# Patient Record
Sex: Female | Born: 1985 | Race: White | Hispanic: No | Smoking: Light tobacco smoker
Health system: Southern US, Community
[De-identification: ages and names within clinical notes are randomized; demographics above are authoritative.]

## PROBLEM LIST (undated history)

## (undated) DIAGNOSIS — N159 Renal tubulo-interstitial disease, unspecified: Secondary | ICD-10-CM

## (undated) DIAGNOSIS — Z8489 Family history of other specified conditions: Secondary | ICD-10-CM

## (undated) DIAGNOSIS — D649 Anemia, unspecified: Secondary | ICD-10-CM

## (undated) DIAGNOSIS — L0291 Cutaneous abscess, unspecified: Secondary | ICD-10-CM

## (undated) DIAGNOSIS — E282 Polycystic ovarian syndrome: Secondary | ICD-10-CM

## (undated) DIAGNOSIS — J9819 Other pulmonary collapse: Secondary | ICD-10-CM

## (undated) HISTORY — PX: BACK SURGERY: SHX140

## (undated) HISTORY — PX: CHOLECYSTECTOMY: SHX55

---
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
## 2004-03-18 ENCOUNTER — Emergency Department (HOSPITAL_COMMUNITY): Admission: EM | Admit: 2004-03-18 | Discharge: 2004-03-18 | Payer: Self-pay | Admitting: Emergency Medicine

## 2004-05-29 ENCOUNTER — Emergency Department (HOSPITAL_COMMUNITY): Admission: EM | Admit: 2004-05-29 | Discharge: 2004-05-29 | Payer: Self-pay | Admitting: Emergency Medicine

## 2004-08-25 ENCOUNTER — Emergency Department (HOSPITAL_COMMUNITY): Admission: EM | Admit: 2004-08-25 | Discharge: 2004-08-25 | Payer: Self-pay | Admitting: Emergency Medicine

## 2005-05-10 ENCOUNTER — Emergency Department (HOSPITAL_COMMUNITY): Admission: EM | Admit: 2005-05-10 | Discharge: 2005-05-10 | Payer: Self-pay | Admitting: Emergency Medicine

## 2007-07-21 ENCOUNTER — Emergency Department (HOSPITAL_COMMUNITY): Admission: EM | Admit: 2007-07-21 | Discharge: 2007-07-21 | Payer: Self-pay | Admitting: Emergency Medicine

## 2007-10-16 ENCOUNTER — Emergency Department (HOSPITAL_COMMUNITY): Admission: EM | Admit: 2007-10-16 | Discharge: 2007-10-17 | Payer: Self-pay | Admitting: Emergency Medicine

## 2007-10-26 ENCOUNTER — Inpatient Hospital Stay (HOSPITAL_COMMUNITY): Admission: AD | Admit: 2007-10-26 | Discharge: 2007-10-26 | Payer: Self-pay | Admitting: Obstetrics and Gynecology

## 2007-11-13 ENCOUNTER — Emergency Department (HOSPITAL_COMMUNITY): Admission: EM | Admit: 2007-11-13 | Discharge: 2007-11-13 | Payer: Self-pay | Admitting: Family Medicine

## 2007-11-21 ENCOUNTER — Inpatient Hospital Stay (HOSPITAL_COMMUNITY): Admission: AD | Admit: 2007-11-21 | Discharge: 2007-11-21 | Payer: Self-pay | Admitting: Obstetrics & Gynecology

## 2008-01-02 ENCOUNTER — Inpatient Hospital Stay (HOSPITAL_COMMUNITY): Admission: AD | Admit: 2008-01-02 | Discharge: 2008-01-02 | Payer: Self-pay | Admitting: Obstetrics & Gynecology

## 2008-01-09 ENCOUNTER — Inpatient Hospital Stay (HOSPITAL_COMMUNITY): Admission: AD | Admit: 2008-01-09 | Discharge: 2008-01-10 | Payer: Self-pay | Admitting: Obstetrics & Gynecology

## 2008-01-10 ENCOUNTER — Inpatient Hospital Stay (HOSPITAL_COMMUNITY): Admission: AD | Admit: 2008-01-10 | Discharge: 2008-01-10 | Payer: Self-pay | Admitting: Obstetrics & Gynecology

## 2008-01-15 ENCOUNTER — Emergency Department (HOSPITAL_COMMUNITY): Admission: EM | Admit: 2008-01-15 | Discharge: 2008-01-15 | Payer: Self-pay | Admitting: Emergency Medicine

## 2008-01-24 ENCOUNTER — Ambulatory Visit (HOSPITAL_COMMUNITY): Admission: RE | Admit: 2008-01-24 | Discharge: 2008-01-24 | Payer: Self-pay | Admitting: Obstetrics

## 2008-03-25 ENCOUNTER — Ambulatory Visit (HOSPITAL_COMMUNITY): Admission: RE | Admit: 2008-03-25 | Discharge: 2008-03-25 | Payer: Self-pay | Admitting: Obstetrics

## 2008-03-26 ENCOUNTER — Inpatient Hospital Stay (HOSPITAL_COMMUNITY): Admission: AD | Admit: 2008-03-26 | Discharge: 2008-03-26 | Payer: Self-pay | Admitting: Obstetrics

## 2008-05-14 ENCOUNTER — Inpatient Hospital Stay (HOSPITAL_COMMUNITY): Admission: AD | Admit: 2008-05-14 | Discharge: 2008-05-14 | Payer: Self-pay | Admitting: Obstetrics

## 2008-05-16 ENCOUNTER — Inpatient Hospital Stay (HOSPITAL_COMMUNITY): Admission: AD | Admit: 2008-05-16 | Discharge: 2008-05-21 | Payer: Self-pay | Admitting: Obstetrics

## 2008-05-18 ENCOUNTER — Encounter: Payer: Self-pay | Admitting: Obstetrics & Gynecology

## 2008-05-18 DIAGNOSIS — O1490 Unspecified pre-eclampsia, unspecified trimester: Secondary | ICD-10-CM

## 2008-06-14 ENCOUNTER — Inpatient Hospital Stay (HOSPITAL_COMMUNITY): Admission: AD | Admit: 2008-06-14 | Discharge: 2008-06-14 | Payer: Self-pay | Admitting: Obstetrics & Gynecology

## 2009-01-02 ENCOUNTER — Emergency Department (HOSPITAL_COMMUNITY): Admission: EM | Admit: 2009-01-02 | Discharge: 2009-01-02 | Payer: Self-pay | Admitting: Emergency Medicine

## 2009-02-02 ENCOUNTER — Emergency Department (HOSPITAL_COMMUNITY): Admission: EM | Admit: 2009-02-02 | Discharge: 2009-02-03 | Payer: Self-pay | Admitting: Emergency Medicine

## 2009-04-26 ENCOUNTER — Emergency Department (HOSPITAL_COMMUNITY): Admission: EM | Admit: 2009-04-26 | Discharge: 2009-04-26 | Payer: Self-pay | Admitting: Emergency Medicine

## 2009-06-18 ENCOUNTER — Emergency Department (HOSPITAL_COMMUNITY): Admission: EM | Admit: 2009-06-18 | Discharge: 2009-06-18 | Payer: Self-pay | Admitting: Internal Medicine

## 2009-08-21 ENCOUNTER — Emergency Department (HOSPITAL_COMMUNITY): Admission: EM | Admit: 2009-08-21 | Discharge: 2009-08-21 | Payer: Self-pay | Admitting: Emergency Medicine

## 2009-12-16 ENCOUNTER — Emergency Department (HOSPITAL_COMMUNITY): Admission: EM | Admit: 2009-12-16 | Discharge: 2009-12-16 | Payer: Self-pay | Admitting: Emergency Medicine

## 2009-12-31 ENCOUNTER — Emergency Department (HOSPITAL_COMMUNITY): Admission: EM | Admit: 2009-12-31 | Discharge: 2009-12-31 | Payer: Self-pay | Admitting: Emergency Medicine

## 2010-01-02 ENCOUNTER — Emergency Department (HOSPITAL_COMMUNITY): Admission: EM | Admit: 2010-01-02 | Discharge: 2010-01-03 | Payer: Self-pay | Admitting: Emergency Medicine

## 2010-01-28 ENCOUNTER — Emergency Department (HOSPITAL_COMMUNITY): Admission: EM | Admit: 2010-01-28 | Discharge: 2010-01-28 | Payer: Self-pay | Admitting: Emergency Medicine

## 2010-06-29 ENCOUNTER — Emergency Department (HOSPITAL_COMMUNITY)
Admission: EM | Admit: 2010-06-29 | Discharge: 2010-06-30 | Payer: Self-pay | Source: Home / Self Care | Admitting: Emergency Medicine

## 2010-07-01 LAB — POCT I-STAT, CHEM 8
BUN: 7 mg/dL (ref 6–23)
Calcium, Ion: 1.19 mmol/L (ref 1.12–1.32)
Chloride: 106 mEq/L (ref 96–112)
Creatinine, Ser: 0.9 mg/dL (ref 0.4–1.2)
Glucose, Bld: 96 mg/dL (ref 70–99)
HCT: 43 % (ref 36.0–46.0)
Hemoglobin: 14.6 g/dL (ref 12.0–15.0)
Potassium: 3.7 mEq/L (ref 3.5–5.1)
Sodium: 142 mEq/L (ref 135–145)
TCO2: 27 mmol/L (ref 0–100)

## 2010-07-01 LAB — DIFFERENTIAL
Basophils Absolute: 0 10*3/uL (ref 0.0–0.1)
Basophils Relative: 0 % (ref 0–1)
Eosinophils Absolute: 0.6 10*3/uL (ref 0.0–0.7)
Eosinophils Relative: 6 % — ABNORMAL HIGH (ref 0–5)
Lymphocytes Relative: 27 % (ref 12–46)
Lymphs Abs: 2.7 10*3/uL (ref 0.7–4.0)
Monocytes Absolute: 0.6 10*3/uL (ref 0.1–1.0)
Monocytes Relative: 6 % (ref 3–12)
Neutro Abs: 6.2 10*3/uL (ref 1.7–7.7)
Neutrophils Relative %: 61 % (ref 43–77)

## 2010-07-01 LAB — URINALYSIS, ROUTINE W REFLEX MICROSCOPIC
Bilirubin Urine: NEGATIVE
Ketones, ur: NEGATIVE mg/dL
Leukocytes, UA: NEGATIVE
Nitrite: NEGATIVE
Protein, ur: NEGATIVE mg/dL
Specific Gravity, Urine: 1.026 (ref 1.005–1.030)
Urine Glucose, Fasting: NEGATIVE mg/dL
Urobilinogen, UA: 1 mg/dL (ref 0.0–1.0)
pH: 6 (ref 5.0–8.0)

## 2010-07-01 LAB — POCT PREGNANCY, URINE: Preg Test, Ur: NEGATIVE

## 2010-07-01 LAB — URINE MICROSCOPIC-ADD ON

## 2010-07-01 LAB — WET PREP, GENITAL
Clue Cells Wet Prep HPF POC: NONE SEEN
Trich, Wet Prep: NONE SEEN
WBC, Wet Prep HPF POC: NONE SEEN
Yeast Wet Prep HPF POC: NONE SEEN

## 2010-07-01 LAB — CBC
HCT: 40.9 % (ref 36.0–46.0)
Hemoglobin: 13.6 g/dL (ref 12.0–15.0)
MCH: 27.4 pg (ref 26.0–34.0)
MCHC: 33.3 g/dL (ref 30.0–36.0)
MCV: 82.3 fL (ref 78.0–100.0)
Platelets: 243 10*3/uL (ref 150–400)
RBC: 4.97 MIL/uL (ref 3.87–5.11)
RDW: 13.3 % (ref 11.5–15.5)
WBC: 10.2 10*3/uL (ref 4.0–10.5)

## 2010-07-01 LAB — GC/CHLAMYDIA PROBE AMP, GENITAL
Chlamydia, DNA Probe: NEGATIVE
GC Probe Amp, Genital: NEGATIVE

## 2010-08-29 LAB — HCG, QUANTITATIVE, PREGNANCY: hCG, Beta Chain, Quant, S: <2 m[IU]/mL (ref <5)

## 2010-09-16 LAB — COMPREHENSIVE METABOLIC PANEL
ALT: <8 U/L (ref 0–35)
AST: 28 U/L (ref 0–37)
Albumin: 4.3 g/dL (ref 3.5–5.2)
Alkaline Phosphatase: 57 U/L (ref 39–117)
BUN: 6 mg/dL (ref 6–23)
CO2: 26 mEq/L (ref 19–32)
Calcium: 8.6 mg/dL (ref 8.4–10.5)
Chloride: 101 mEq/L (ref 96–112)
Creatinine, Ser: 0.75 mg/dL (ref 0.4–1.2)
GFR calc Af Amer: >60 mL/min (ref >60)
GFR calc non Af Amer: >60 mL/min (ref >60)
Glucose, Bld: 89 mg/dL (ref 70–99)
Potassium: 4.5 mEq/L (ref 3.5–5.1)
Sodium: 133 mEq/L — ABNORMAL LOW (ref 135–145)
Total Bilirubin: 1.2 mg/dL (ref 0.3–1.2)
Total Protein: 7.8 g/dL (ref 6.0–8.3)

## 2010-09-16 LAB — CBC
HCT: 43.9 % (ref 36.0–46.0)
Hemoglobin: 14.9 g/dL (ref 12.0–15.0)
MCHC: 34 g/dL (ref 30.0–36.0)
MCV: 82.3 fL (ref 78.0–100.0)
Platelets: 231 10*3/uL (ref 150–400)
RBC: 5.33 MIL/uL — ABNORMAL HIGH (ref 3.87–5.11)
RDW: 14.3 % (ref 11.5–15.5)
WBC: 7.4 10*3/uL (ref 4.0–10.5)

## 2010-09-16 LAB — URINALYSIS, ROUTINE W REFLEX MICROSCOPIC
Bilirubin Urine: NEGATIVE
Glucose, UA: NEGATIVE mg/dL
Ketones, ur: NEGATIVE mg/dL
Nitrite: NEGATIVE
Specific Gravity, Urine: 1.026 (ref 1.005–1.030)
pH: 6 (ref 5.0–8.0)

## 2010-09-16 LAB — GC/CHLAMYDIA PROBE AMP, GENITAL
Chlamydia, DNA Probe: NEGATIVE
GC Probe Amp, Genital: NEGATIVE

## 2010-09-16 LAB — DIFFERENTIAL
Basophils Absolute: 0.1 10*3/uL (ref 0.0–0.1)
Eosinophils Relative: 6 % — ABNORMAL HIGH (ref 0–5)
Lymphocytes Relative: 23 % (ref 12–46)
Lymphs Abs: 1.7 10*3/uL (ref 0.7–4.0)
Monocytes Absolute: 0.5 10*3/uL (ref 0.1–1.0)
Neutro Abs: 4.7 10*3/uL (ref 1.7–7.7)

## 2010-09-16 LAB — WET PREP, GENITAL: Yeast Wet Prep HPF POC: NONE SEEN

## 2010-10-27 NOTE — Discharge Summary (Signed)
[name], [name]                ACCOUNT NO.:  [id number]   MEDICAL RECORD NO.:  [id number]          PATIENT TYPE:  INP   LOCATION:  9110                          FACILITY:  [facility]   PHYSICIAN:  Charles A. Clearance Coots, M.D.DATE OF BIRTH:  01/28/1986   DATE OF ADMISSION:  05/16/2008  DATE OF DISCHARGE:  05/21/2008                               DISCHARGE SUMMARY   ADMITTING DIAGNOSES:  1. Term pregnancy.  2. Pregnancy-induced hypertension.   DISCHARGE DIAGNOSES:  1. Term pregnancy.  2. Pregnancy-induced hypertension.  3. Superimposed preeclampsia, severe.  4. Status post failed induction of labor.  5. Primary low transverse cesarean section, delivery on May 18, 2008.  6. A viable female was delivered at 0940, Apgars of 9 at one minute and      9 at five minutes, weight of 2410 g.  7. Mother and infant discharged home in good condition.   REASON FOR ADMISSION:  A 25 year old, G1, estimated date of confinement  of June 10, 2008, seen in the office with elevated blood pressures  and sent over to [facility] for further management.  The patient was admitted and started on magnesium sulfate prophylaxis  for seizure control and blood pressures continued to be elevated and a  decision was made to proceed with induction of labor.  The Pitocin was  started and the patient had no change essentially on examination after  the active rupture of membranes, and intrauterine  pressure catheter  placement, and aggressive Pitocin, and induction of labor.  She  progressed to 2 cm dilatation and 80% effacement.  I made no further  progress after that point.  A decision was made to proceed with cesarean  section of labor for failed induction of labor after greater than 24  hours of trial of induction of labor for severe preeclampsia.  Primary  low transverse cesarean section was performed on May 18, 2008.  There are no intraoperative complications.  Postoperative course was  uncomplicated.  The patient did have a postop anemia, which was  clinically stable with no orthostatic changes and the patient was able  to ambulate without dizziness or headache.  Her hemoglobin preop was  borderline at 10 with a postop hemoglobin of 7.9.  The patient was  therefore discharged home on postop day #3 in good condition.   ADMITTING LABS:  Hemoglobin 10, hematocrit 30, white blood cell count  6900, and platelets 181,000.   DISCHARGE LABS:  Hemoglobin 7.9, hematocrit 23.2, white blood cell count  5500, and platelets 161,000.  Comprehensive metabolic panel was within  normal limits.   DISCHARGE DISPOSITION:  Medications, continue prenatal vitamins, iron  was prescribed for anemia, and ibuprofen and Percocet was prescribed for  pain.  Routine written instructions were given for discharge after  cesarean section.  The patient is to call our office for a followup  appointment in 2 weeks.      Charles A. Clearance Coots, M.D.  Electronically Signed     CAH/MEDQ  D:  05/21/2008  T:  05/21/2008  Job:  [id number]

## 2010-10-27 NOTE — Op Note (Signed)
NAMEPERRIS, TRIPATHI                ACCOUNT NO.:  [id number]   MEDICAL RECORD NO.:  [id number]          PATIENT TYPE:  INP   LOCATION:  9371                          [facility]ILITY:  [facility]   PHYSICIAN:  Roseanna Rainbow, M.D.DATE OF BIRTH:  1986/04/15   DATE OF PROCEDURE:  DATE OF DISCHARGE:                               OPERATIVE REPORT   PREOPERATIVE DIAGNOSES:  Intrauterine pregnancy at 37 weeks, severe  preeclampsia, failed induction of labor.   POSTOPERATIVE DIAGNOSES:  Intrauterine pregnancy at 37 weeks, severe  preeclampsia, failed induction of labor.   PROCEDURE:  Primary low uterine flap elliptical cesarean delivery via  Pfannenstiel skin incision.   SURGEON:  Roseanna Rainbow, MD   ANESTHESIA:  Spinal.   PATHOLOGY:  Placenta.   ESTIMATED BLOOD LOSS:  600 mL.   COMPLICATIONS:  None.   PROCEDURE:  The patient was taken to the operating room with an IV  running.  Spinal anesthetic was administered.  She was then placed in  the dorsal supine position with a leftward tilt and prepped and draped  in the usual sterile fashion.  After time-out had been completed, a  Pfannenstiel skin incision was then made with the scalpel and carried  down to the underlying fascia.  The fascia was nicked in the midline.  The fascial incision was then extended bilaterally.  The superior aspect  of the fascial incision was tented up and the underlying rectus muscles  dissected off.  The inferior aspect of the fascial incision was  manipulated in a similar fashion.  The rectus muscles were separated in  the midline.  The parietal peritoneum was tented up and entered sharply.  This incision was then extended superiorly and inferiorly with good  visualization of the bladder.  The bladder blade was then placed.  The  vesicouterine peritoneum was then tented up and entered sharply.  This  incision was then extended bilaterally and the bladder flap created  bluntly.  The bladder blade was then  replaced.  The lower uterine  segment was incised in a transverse fashion with the scalpel.  The  incision was extended with bandage scissors.  The infant's head was then  delivered atraumatically.  The oropharynx was suctioned with bulb  suction.  The cord was clamped and cut.  The infant was handed off to  the awaiting neonatologist.  The Apgars were 9 at one and five minutes  respectively.  The placenta was then removed.  The intrauterine cavity  was evacuated of any remaining amniotic fluid clots and debris with  moist laparotomy sponge.  The uterine incision was then reapproximated  in a running interlocking fashion using a suture of 0 Monocryl.  A  second imbricating layer of the same suture was placed.  Individual  bleeding points were secured with figure-of-eight sutures of the same.  The paracolic gutters were then copiously irrigated.  The parietal  peritoneum was then reapproximated in a running fashion using 2-0 Vicryl  suture.  The fascia was closed in a running fashion using 0 Vicryl sutures  meeting in the midline.  The skin was  closed in a subcuticular fashion  using 3-0 Monocryl.  At the close of the procedure, the instrument and  pack counts were said to be correct x2.  The patient was taken to the  PACU awake and in stable condition.      Roseanna Rainbow, M.D.  Electronically Signed     LAJ/MEDQ  D:  05/18/2008  T:  05/18/2008  Job:  [id number]

## 2010-12-16 ENCOUNTER — Emergency Department (HOSPITAL_COMMUNITY)
Admission: EM | Admit: 2010-12-16 | Discharge: 2010-12-16 | Disposition: A | Discharge status: Home / Self Care | Payer: 59 | Attending: Emergency Medicine | Admitting: Emergency Medicine

## 2010-12-16 DIAGNOSIS — M545 Low back pain, unspecified: Secondary | ICD-10-CM | POA: Insufficient documentation

## 2010-12-16 DIAGNOSIS — X500XXA Overexertion from strenuous movement or load, initial encounter: Secondary | ICD-10-CM | POA: Insufficient documentation

## 2010-12-16 DIAGNOSIS — S335XXA Sprain of ligaments of lumbar spine, initial encounter: Secondary | ICD-10-CM | POA: Insufficient documentation

## 2010-12-16 DIAGNOSIS — R209 Unspecified disturbances of skin sensation: Secondary | ICD-10-CM | POA: Insufficient documentation

## 2010-12-16 DIAGNOSIS — M79609 Pain in unspecified limb: Secondary | ICD-10-CM | POA: Insufficient documentation

## 2011-01-31 ENCOUNTER — Emergency Department (HOSPITAL_COMMUNITY): Payer: 59

## 2011-01-31 ENCOUNTER — Emergency Department (HOSPITAL_COMMUNITY)
Admission: EM | Admit: 2011-01-31 | Discharge: 2011-01-31 | Disposition: A | Discharge status: Home / Self Care | Payer: 59 | Attending: Emergency Medicine | Admitting: Emergency Medicine

## 2011-01-31 DIAGNOSIS — R110 Nausea: Secondary | ICD-10-CM | POA: Insufficient documentation

## 2011-01-31 DIAGNOSIS — K8020 Calculus of gallbladder without cholecystitis without obstruction: Secondary | ICD-10-CM | POA: Insufficient documentation

## 2011-01-31 DIAGNOSIS — R10816 Epigastric abdominal tenderness: Secondary | ICD-10-CM | POA: Insufficient documentation

## 2011-01-31 DIAGNOSIS — M549 Dorsalgia, unspecified: Secondary | ICD-10-CM | POA: Insufficient documentation

## 2011-01-31 LAB — URINALYSIS, ROUTINE W REFLEX MICROSCOPIC
Bilirubin Urine: NEGATIVE
Leukocytes, UA: NEGATIVE
Nitrite: NEGATIVE
Specific Gravity, Urine: 1.024 (ref 1.005–1.030)
Urobilinogen, UA: 0.2 mg/dL (ref 0.0–1.0)

## 2011-01-31 LAB — COMPREHENSIVE METABOLIC PANEL
Albumin: 4 g/dL (ref 3.5–5.2)
BUN: 10 mg/dL (ref 6–23)
Calcium: 9.6 mg/dL (ref 8.4–10.5)
Chloride: 103 mEq/L (ref 96–112)
Creatinine, Ser: 0.69 mg/dL (ref 0.50–1.10)
Total Bilirubin: 0.2 mg/dL — ABNORMAL LOW (ref 0.3–1.2)
Total Protein: 7.9 g/dL (ref 6.0–8.3)

## 2011-01-31 LAB — CBC
HCT: 41.4 % (ref 36.0–46.0)
Hemoglobin: 14.1 g/dL (ref 12.0–15.0)
MCH: 27 pg (ref 26.0–34.0)
MCHC: 34.1 g/dL (ref 30.0–36.0)
MCV: 79.3 fL (ref 78.0–100.0)
Platelets: 304 10*3/uL (ref 150–400)
RBC: 5.22 MIL/uL — ABNORMAL HIGH (ref 3.87–5.11)
RDW: 14.4 % (ref 11.5–15.5)
WBC: 11.5 K/uL — ABNORMAL HIGH (ref 4.0–10.5)

## 2011-01-31 LAB — DIFFERENTIAL
Basophils Absolute: 0 K/uL (ref 0.0–0.1)
Basophils Relative: 0 % (ref 0–1)
Eosinophils Absolute: 0.5 10*3/uL (ref 0.0–0.7)
Eosinophils Relative: 4 % (ref 0–5)
Lymphocytes Relative: 33 % (ref 12–46)
Lymphs Abs: 3.8 10*3/uL (ref 0.7–4.0)
Monocytes Absolute: 1 10*3/uL (ref 0.1–1.0)
Monocytes Relative: 9 % (ref 3–12)
Neutro Abs: 6.2 K/uL (ref 1.7–7.7)
Neutrophils Relative %: 54 % (ref 43–77)

## 2011-01-31 LAB — COMPREHENSIVE METABOLIC PANEL WITH GFR
ALT: 13 U/L (ref 0–35)
AST: 16 U/L (ref 0–37)
Alkaline Phosphatase: 63 U/L (ref 39–117)
CO2: 25 meq/L (ref 19–32)
GFR calc Af Amer: >60 mL/min (ref >60)
GFR calc non Af Amer: >60 mL/min (ref >60)
Glucose, Bld: 108 mg/dL — ABNORMAL HIGH (ref 70–99)
Potassium: 3.7 meq/L (ref 3.5–5.1)
Sodium: 140 meq/L (ref 135–145)

## 2011-01-31 LAB — URINE MICROSCOPIC-ADD ON

## 2011-01-31 LAB — LIPASE, BLOOD: Lipase: 41 U/L (ref 11–59)

## 2011-01-31 LAB — POCT PREGNANCY, URINE: Preg Test, Ur: NEGATIVE

## 2011-02-01 LAB — URINE CULTURE
Colony Count: 55000
Culture  Setup Time: 201208191707

## 2011-03-05 LAB — POCT PREGNANCY, URINE: Preg Test, Ur: NEGATIVE

## 2011-03-11 LAB — WET PREP, GENITAL
Clue Cells Wet Prep HPF POC: NONE SEEN
Trich, Wet Prep: NONE SEEN
Yeast Wet Prep HPF POC: NONE SEEN

## 2011-03-12 LAB — BASIC METABOLIC PANEL
BUN: 3 — ABNORMAL LOW
CO2: 23
Calcium: 9
Creatinine, Ser: 0.47
Glucose, Bld: 91
Sodium: 136

## 2011-03-12 LAB — URINALYSIS, ROUTINE W REFLEX MICROSCOPIC
Bilirubin Urine: NEGATIVE
Bilirubin Urine: NEGATIVE
Glucose, UA: NEGATIVE
Glucose, UA: NEGATIVE
Hgb urine dipstick: NEGATIVE
Ketones, ur: NEGATIVE
Ketones, ur: NEGATIVE
Nitrite: NEGATIVE
Protein, ur: NEGATIVE
Specific Gravity, Urine: 1.01
Urobilinogen, UA: 0.2
pH: 7.5

## 2011-03-12 LAB — DIFFERENTIAL
Basophils Absolute: 0
Basophils Relative: 0
Blasts: 0
Lymphocytes Relative: 16
Lymphs Abs: 1.6
Myelocytes: 0
Neutro Abs: 7.6
Neutrophils Relative %: 77
Promyelocytes Absolute: 0

## 2011-03-12 LAB — CBC
MCHC: 33.8
MCV: 85.5
RBC: 4
RDW: 13.3

## 2011-03-15 LAB — URINALYSIS, ROUTINE W REFLEX MICROSCOPIC
Nitrite: NEGATIVE
Specific Gravity, Urine: <1.005 — ABNORMAL LOW
Urobilinogen, UA: 0.2
pH: 6

## 2011-03-19 LAB — URINALYSIS, ROUTINE W REFLEX MICROSCOPIC
Bilirubin Urine: NEGATIVE
Glucose, UA: NEGATIVE mg/dL
Hgb urine dipstick: NEGATIVE
Ketones, ur: NEGATIVE mg/dL
Ketones, ur: NEGATIVE mg/dL
Leukocytes, UA: NEGATIVE
Nitrite: NEGATIVE
Protein, ur: 100 mg/dL — AB
Protein, ur: >300 mg/dL — AB

## 2011-03-19 LAB — COMPREHENSIVE METABOLIC PANEL
ALT: 8 U/L (ref 0–35)
ALT: 9 U/L (ref 0–35)
AST: 14 U/L (ref 0–37)
AST: 19 U/L (ref 0–37)
Albumin: 2.4 g/dL — ABNORMAL LOW (ref 3.5–5.2)
Alkaline Phosphatase: 69 U/L (ref 39–117)
Alkaline Phosphatase: 94 U/L (ref 39–117)
BUN: 4 mg/dL — ABNORMAL LOW (ref 6–23)
CO2: 23 mEq/L (ref 19–32)
CO2: 27 mEq/L (ref 19–32)
Chloride: 107 mEq/L (ref 96–112)
Chloride: 109 mEq/L (ref 96–112)
Chloride: 111 mEq/L (ref 96–112)
Creatinine, Ser: 0.54 mg/dL (ref 0.4–1.2)
GFR calc Af Amer: >60 mL/min (ref >60)
GFR calc Af Amer: >60 mL/min (ref >60)
GFR calc non Af Amer: >60 mL/min (ref >60)
GFR calc non Af Amer: >60 mL/min (ref >60)
GFR calc non Af Amer: >60 mL/min (ref >60)
Glucose, Bld: 77 mg/dL (ref 70–99)
Glucose, Bld: 93 mg/dL (ref 70–99)
Glucose, Bld: 97 mg/dL (ref 70–99)
Potassium: 3.6 mEq/L (ref 3.5–5.1)
Potassium: 3.7 mEq/L (ref 3.5–5.1)
Sodium: 138 mEq/L (ref 135–145)
Sodium: 140 mEq/L (ref 135–145)
Total Bilirubin: 0.2 mg/dL — ABNORMAL LOW (ref 0.3–1.2)
Total Bilirubin: 0.3 mg/dL (ref 0.3–1.2)

## 2011-03-19 LAB — URINE CULTURE

## 2011-03-19 LAB — DIFFERENTIAL
Basophils Absolute: 0 K/uL (ref 0.0–0.1)
Basophils Relative: 0 % (ref 0–1)
Eosinophils Absolute: 0 K/uL (ref 0.0–0.7)
Eosinophils Relative: 1 % (ref 0–5)
Lymphocytes Relative: 14 % (ref 12–46)
Lymphs Abs: 0.9 K/uL (ref 0.7–4.0)
Monocytes Absolute: 0.5 K/uL (ref 0.1–1.0)
Monocytes Relative: 7 % (ref 3–12)
Neutro Abs: 5.4 K/uL (ref 1.7–7.7)
Neutrophils Relative %: 78 % — ABNORMAL HIGH (ref 43–77)

## 2011-03-19 LAB — RPR: RPR Ser Ql: NONREACTIVE

## 2011-03-19 LAB — CBC
HCT: 30.3 % — ABNORMAL LOW (ref 36.0–46.0)
Hemoglobin: 10.1 g/dL — ABNORMAL LOW (ref 12.0–15.0)
Hemoglobin: 11.1 g/dL — ABNORMAL LOW (ref 12.0–15.0)
Hemoglobin: 7.9 g/dL — CL (ref 12.0–15.0)
Hemoglobin: 9.4 g/dL — ABNORMAL LOW (ref 12.0–15.0)
MCHC: 33 g/dL (ref 30.0–36.0)
MCV: 78.2 fL (ref 78.0–100.0)
Platelets: 181 10*3/uL (ref 150–400)
Platelets: 225 10*3/uL (ref 150–400)
RBC: 2.92 MIL/uL — ABNORMAL LOW (ref 3.87–5.11)
RBC: 3.54 MIL/uL — ABNORMAL LOW (ref 3.87–5.11)
RDW: 15.8 % — ABNORMAL HIGH (ref 11.5–15.5)
WBC: 5.5 10*3/uL (ref 4.0–10.5)
WBC: 6.9 10*3/uL (ref 4.0–10.5)
WBC: 6.9 10*3/uL (ref 4.0–10.5)

## 2011-03-19 LAB — LACTATE DEHYDROGENASE
LDH: 133 U/L (ref 94–250)
LDH: 213 U/L (ref 94–250)

## 2011-03-19 LAB — URIC ACID: Uric Acid, Serum: 5.5 mg/dL (ref 2.4–7.0)

## 2012-11-11 ENCOUNTER — Encounter (HOSPITAL_COMMUNITY): Payer: Self-pay | Admitting: Emergency Medicine

## 2012-11-11 ENCOUNTER — Emergency Department (HOSPITAL_COMMUNITY)
Admission: EM | Admit: 2012-11-11 | Discharge: 2012-11-11 | Disposition: A | Discharge status: Home / Self Care | Payer: Medicaid Other | Attending: Emergency Medicine | Admitting: Emergency Medicine

## 2012-11-11 DIAGNOSIS — T7840XA Allergy, unspecified, initial encounter: Secondary | ICD-10-CM

## 2012-11-11 DIAGNOSIS — Y929 Unspecified place or not applicable: Secondary | ICD-10-CM | POA: Insufficient documentation

## 2012-11-11 DIAGNOSIS — Z880 Allergy status to penicillin: Secondary | ICD-10-CM | POA: Insufficient documentation

## 2012-11-11 DIAGNOSIS — W57XXXA Bitten or stung by nonvenomous insect and other nonvenomous arthropods, initial encounter: Secondary | ICD-10-CM | POA: Insufficient documentation

## 2012-11-11 DIAGNOSIS — S30860A Insect bite (nonvenomous) of lower back and pelvis, initial encounter: Secondary | ICD-10-CM | POA: Insufficient documentation

## 2012-11-11 DIAGNOSIS — L500 Allergic urticaria: Secondary | ICD-10-CM | POA: Insufficient documentation

## 2012-11-11 DIAGNOSIS — E282 Polycystic ovarian syndrome: Secondary | ICD-10-CM | POA: Insufficient documentation

## 2012-11-11 DIAGNOSIS — Y939 Activity, unspecified: Secondary | ICD-10-CM | POA: Insufficient documentation

## 2012-11-11 HISTORY — DX: Polycystic ovarian syndrome: E28.2

## 2012-11-11 MED ORDER — DIPHENHYDRAMINE HCL 25 MG PO CAPS
50.0000 mg | ORAL_CAPSULE | Freq: Once | ORAL | Status: AC
  Administered 2012-11-11: 50 mg via ORAL
  Filled 2012-11-11: qty 2

## 2012-11-11 MED ORDER — PREDNISONE 20 MG PO TABS
40.0000 mg | ORAL_TABLET | Freq: Once | ORAL | Status: AC
  Administered 2012-11-11: 40 mg via ORAL
  Filled 2012-11-11: qty 2

## 2012-11-11 MED ORDER — FAMOTIDINE 20 MG PO TABS
20.0000 mg | ORAL_TABLET | Freq: Once | ORAL | Status: AC
  Administered 2012-11-11: 20 mg via ORAL
  Filled 2012-11-11: qty 1

## 2012-11-11 NOTE — ED Notes (Signed)
Pt c/o being bitten by green caterpillar and now having hives to stomach and legs with itching

## 2012-11-11 NOTE — Discharge Instructions (Signed)
For symptomatic relief you may take over the counter benadryl by mouth (1 tab, 25 mg) every 6 hours until itching subsides as well as over the counter Pepcid (20 mg) twice a day. Follow up with your doctor if symptoms persist or worsen.  Establish relationship with primary care doctor as discussed. A resource guide and information on the Affordable Care Act has been provided for your information.    RESOURCE GUIDE  If you do not have a primary care doctor to follow up with regarding today's visit, please call the [facility] at [phone] to make an appointment. Hours of operation are 10am - 7pm, Monday through Friday, and they have a sliding scale fee.   Insufficient Money for Medicine: Contact [employer]:  call "211" or [facility] [phone].  No Primary Care Doctor: - Call [facility]  [phone] - can help you locate a primary care doctor that  accepts your insurance, provides certain services, etc. - Physician Referral Service- [phone]  Agencies that provide inexpensive medical care: - [facility]  [phone] - [facility]  [phone] - [facility]  [phone] - [facility]  [phone] - [facility]  [phone] [facility]  [phone]  Medicaid-accepting [facility] Providers: - [facility]- [address]  [phone], Mon-Fri 9am-7pm, Sat 9am-1pm - [facility]- [address] [place], [address] [place]  [phone] - [facility]- [address] [place]  [phone] [facility]- [address]  [phone] - Renaye Rakers- [address] [place], [address], [phone]  Only accepts [place] patients after they have their name  applied to their card  Self Pay (no insurance) in [place]: - Sickle Cell Patients: Dr Willey Blade, [facility]  [address] [place],  [phone] - [facility]- [address] [place]  [phone]       [facility]- [address]       -     [facility]- see information above (Speak to [employer] if you do not have insurance)       -  [facility]- [address] [place], [phone]       -  [facility]- [address],  [phone]       -  [facility]- [address], [phone]       -  Dr Julio Sicks-  [address], [place], [phone]       -  [facility]- [address], [phone]       -  [facility]- [address], [phone], also [address] [phone]       -    [facility]- [address] [place], [phone], 1st & 3rd Saturday   every month, 10am-1pm  1) Find a Doctor and Pay Out of Pocket Although you won't have to find out who is covered by your insurance plan, it is a good idea to ask around and get recommendations. You will then need to call the office and see if the doctor you have chosen will accept you as a new patient and what types of options they offer for patients who are self-pay. Some doctors offer discounts or will  set up payment plans for their patients who do not have insurance, but you will need to ask so you aren't surprised when you get to your appointment.  2) Contact Your Local Health Department Not all health departments have doctors that can see patients for sick visits, but many do, so it is worth a call to see if yours does. If you don't know where your local health department is, you can check in your phone book. The CDC also has a tool to help you locate your state's health department, and many state websites also have listings of all of their local health departments.  3) Find a Walk-in Clinic If your illness is not likely to be very severe or complicated, you may want to try a walk in clinic. These are popping up all over the country in pharmacies,  drugstores, and shopping centers. They're usually staffed by nurse practitioners or physician assistants that have been trained to treat common illnesses and complaints. They're usually fairly quick and inexpensive. However, if you have serious medical issues or chronic medical problems, these are probably not your best option

## 2012-11-11 NOTE — ED Provider Notes (Signed)
History     CSN: [id number]  Arrival date & time 11/11/12  1316   First MD Initiated Contact with Patient 11/11/12 1336      Chief Complaint  Patient presents with  . Rash  . Urticaria    (Consider location/radiation/quality/duration/timing/severity/associated sxs/prior treatment) HPI Comments: 27 y.o. 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 Pt states she was outside when she began to feel itchy on her abdomen. As she started to scratch, she noticed a green caterpillar fall out of her clothes. Hives quickly developed, turned bright red, became extremely pruitic, and spread to cover a 10 cm area of her upper abdomen. Then she also noted breaking out on bilateral anterior thighs. Pt states she "became freaked out" because her son is highly allergic to mosquito bites and she had a previous anaphylactic episode to PCN so came straight to the ER. Pt states that as she was sitting and waiting, she already feels the rash/itching resolving.   Pt denies any difficulty breathing, any sensation of her throat closing, any swelling of lips face or tongue, nausea, vomiting.   Patient is a 27 y.o. female presenting with allergic reaction. The history is provided by the patient.  Allergic Reaction Presenting symptoms: rash   Presenting symptoms: no difficulty breathing, no difficulty swallowing and no wheezing   Presenting symptoms comment:  Itching, rash, and swelling present on upper abdomen, and anterior thighs bilaterally Severity:  Moderate Prior allergic episodes:  Allergies to medications (anaphylactic reaction to PCN) Context: insect bite/sting   Relieved by:  None tried Worsened by:  Nothing tried Ineffective treatments:  None tried   Past Medical History  Diagnosis Date  . PCOS (polycystic ovarian syndrome)     History reviewed. No pertinent past surgical history.  History  reviewed. No pertinent family history.  History  Substance Use Topics  . Smoking status: Never Smoker   . Smokeless tobacco: Not on file  . Alcohol Use: No    OB History   Grav Para Term Preterm Abortions TAB SAB Ect Mult Living                  Review of Systems  Constitutional: Negative for fever and diaphoresis.  HENT: Negative for sore throat, drooling, trouble swallowing, neck pain, neck stiffness and voice change.   Eyes: Negative for visual disturbance.  Respiratory: Negative for apnea, chest tightness, shortness of breath and wheezing.   Cardiovascular: Negative for chest pain and palpitations.  Gastrointestinal: Negative for nausea, vomiting, diarrhea and constipation.  Genitourinary: Negative for dysuria.  Musculoskeletal: Negative for gait problem.  Skin: Positive for rash.       Upper abdomen, bilateral anterior thighs  Neurological: Negative for dizziness, weakness, light-headedness, numbness and headaches.    Allergies  Penicillins  Home Medications  No current outpatient prescriptions on file.  BP 120/73  Pulse 90  Temp(Src) 97.8 F (36.6 C) (Oral)  Resp 18  SpO2 97%  Physical Exam  Nursing note and vitals reviewed. Constitutional: She is oriented to person, place, and time. She appears well-developed and well-nourished. No distress.  HENT:  Head: Normocephalic and atraumatic. No trismus in the jaw.  Mouth/Throat: No posterior oropharyngeal edema or posterior oropharyngeal erythema.  Airway patent, no edema of eyelids, face, lips, tongue, or throat  Eyes: Conjunctivae and EOM are normal.  Neck: Normal range of motion. Neck supple.  No meningeal signs  Cardiovascular:  Normal rate, regular rhythm and normal heart sounds.  Exam reveals no gallop and no friction rub.   No murmur heard. Pulmonary/Chest: Effort normal and breath sounds normal. No respiratory distress. She has no wheezes. She has no rales. She exhibits no tenderness.  Abdominal: Soft.  Bowel sounds are normal. She exhibits no distension. There is no tenderness. There is no rebound and no guarding.  Musculoskeletal: Normal range of motion. She exhibits no edema and no tenderness.  FROM to upper and lower extremities  Neurological: She is alert and oriented to person, place, and time. No cranial nerve deficit.  Speech is clear and goal oriented, follows commands Sensation normal to light touch  Moves extremities without ataxia, coordination intact Normal gait and balance Normal strength in upper and lower extremities bilaterally including dorsiflexion and plantar flexion, strong and equal grip strength   Skin: Skin is warm and dry. She is not diaphoretic. No erythema.  Urticaria spread across 10 cm portion of upper abdomen, more left sided than right. Also covering 5 cm and 3 cm portion of anterior thigh left and right respectively.   Psychiatric: She has a normal mood and affect.    ED Course  Procedures (including critical care time) Medications  diphenhydrAMINE (BENADRYL) capsule 50 mg (50 mg Oral Given 11/11/12 1415)  famotidine (PEPCID) tablet 20 mg (20 mg Oral Given 11/11/12 1416)  predniSONE (DELTASONE) tablet 40 mg (40 mg Oral Given 11/11/12 1416)    Labs Reviewed - No data to display No results found.  1. Allergic reaction, initial encounter       MDM  Patient re-evaluated prior to dc, is hemodynamically stable, in no respiratory distress, and denies the feeling of throat closing. Pt has been advised to take OTC benadryl and pepcid. Pt stated she would rather not take the steroids at home and really wanted to return home. She feels she over-reacted and states she feels embarrassed. That she is feeling much better and just wants to go. Directed pt not to hesitate to return to the ED if she has a mod-severe allergic rxn (s/s including throat closing, difficulty breathing, swelling of lips face or tongue). Pt is new to area so also provided community resource while  her insurance is pending with her new job. Pt is agreeable with plan & verbalizes understanding.     [place]ade Nurse, PA-C 11/11/12 2015

## 2012-11-12 NOTE — ED Provider Notes (Signed)
Medical screening examination/treatment/procedure(s) were performed by non-physician practitioner and as supervising physician I was immediately available for consultation/collaboration.   Sritha Chauncey, MD 11/12/12 0700 

## 2013-01-04 ENCOUNTER — Ambulatory Visit: Payer: Self-pay | Admitting: Obstetrics

## 2013-12-12 DIAGNOSIS — J9819 Other pulmonary collapse: Secondary | ICD-10-CM

## 2013-12-12 DIAGNOSIS — N159 Renal tubulo-interstitial disease, unspecified: Secondary | ICD-10-CM

## 2013-12-12 HISTORY — DX: Renal tubulo-interstitial disease, unspecified: N15.9

## 2013-12-12 HISTORY — DX: Other pulmonary collapse: J98.19

## 2014-01-03 ENCOUNTER — Emergency Department: Payer: Self-pay | Admitting: Emergency Medicine

## 2014-01-03 LAB — CBC
HCT: 42.1 % (ref 35.0–47.0)
HGB: 13.6 g/dL (ref 12.0–16.0)
MCH: 27.6 pg (ref 26.0–34.0)
MCHC: 32.2 g/dL (ref 32.0–36.0)
MCV: 86 fL (ref 80–100)
Platelet: 219 10*3/uL (ref 150–440)
RBC: 4.92 10*6/uL (ref 3.80–5.20)
RDW: 13.6 % (ref 11.5–14.5)
WBC: 8.7 10*3/uL (ref 3.6–11.0)

## 2014-01-03 LAB — BASIC METABOLIC PANEL
Anion Gap: 4 — ABNORMAL LOW (ref 7–16)
BUN: 10 mg/dL (ref 7–18)
CALCIUM: 8.5 mg/dL (ref 8.5–10.1)
CREATININE: 0.93 mg/dL (ref 0.60–1.30)
Chloride: 107 mmol/L (ref 98–107)
Co2: 27 mmol/L (ref 21–32)
EGFR (African American): >60
EGFR (Non-African Amer.): >60
Glucose: 107 mg/dL — ABNORMAL HIGH (ref 65–99)
Osmolality: 275 (ref 275–301)
POTASSIUM: 4 mmol/L (ref 3.5–5.1)
SODIUM: 138 mmol/L (ref 136–145)

## 2014-01-03 LAB — TROPONIN I: Troponin-I: <0.02 ng/mL

## 2014-01-03 LAB — D-DIMER(ARMC): D-Dimer: 184 ng/ml

## 2014-01-07 LAB — CBC
HCT: 41.6 % (ref 35.0–47.0)
HGB: 13.9 g/dL (ref 12.0–16.0)
MCH: 28.1 pg (ref 26.0–34.0)
MCHC: 33.3 g/dL (ref 32.0–36.0)
MCV: 84 fL (ref 80–100)
Platelet: 253 10*3/uL (ref 150–440)
RBC: 4.94 10*6/uL (ref 3.80–5.20)
RDW: 14.1 % (ref 11.5–14.5)
WBC: 11 10*3/uL (ref 3.6–11.0)

## 2014-01-07 LAB — DRUG SCREEN, URINE
Amphetamines, Ur Screen: NEGATIVE (ref ?–1000)
BENZODIAZEPINE, UR SCRN: NEGATIVE (ref ?–200)
Barbiturates, Ur Screen: NEGATIVE (ref ?–200)
Cannabinoid 50 Ng, Ur ~~LOC~~: NEGATIVE (ref ?–50)
Cocaine Metabolite,Ur ~~LOC~~: NEGATIVE (ref ?–300)
MDMA (ECSTASY) UR SCREEN: NEGATIVE (ref ?–500)
METHADONE, UR SCREEN: NEGATIVE (ref ?–300)
Opiate, Ur Screen: POSITIVE (ref ?–300)
Phencyclidine (PCP) Ur S: NEGATIVE (ref ?–25)
TRICYCLIC, UR SCREEN: NEGATIVE (ref ?–1000)

## 2014-01-07 LAB — DIFFERENTIAL
Basophil #: 0.1 10*3/uL (ref 0.0–0.1)
Basophil %: 1 %
EOS ABS: 0.5 10*3/uL (ref 0.0–0.7)
Eosinophil %: 4.4 %
Lymphocyte #: 2.3 10*3/uL (ref 1.0–3.6)
Lymphocyte %: 20.8 %
MONO ABS: 0.8 x10 3/mm (ref 0.2–0.9)
MONOS PCT: 7.1 %
NEUTROS PCT: 66.7 %
Neutrophil #: 7.3 10*3/uL — ABNORMAL HIGH (ref 1.4–6.5)

## 2014-01-07 LAB — COMPREHENSIVE METABOLIC PANEL
ALT: 31 U/L
Albumin: 3.6 g/dL (ref 3.4–5.0)
Alkaline Phosphatase: 67 U/L
Anion Gap: 7 (ref 7–16)
BUN: 8 mg/dL (ref 7–18)
Bilirubin,Total: 0.4 mg/dL (ref 0.2–1.0)
CALCIUM: 8.8 mg/dL (ref 8.5–10.1)
Chloride: 107 mmol/L (ref 98–107)
Co2: 27 mmol/L (ref 21–32)
Creatinine: 1.23 mg/dL (ref 0.60–1.30)
EGFR (Non-African Amer.): 59 — ABNORMAL LOW
Glucose: 77 mg/dL (ref 65–99)
OSMOLALITY: 278 (ref 275–301)
POTASSIUM: 3.5 mmol/L (ref 3.5–5.1)
SGOT(AST): 18 U/L (ref 15–37)
SODIUM: 141 mmol/L (ref 136–145)
Total Protein: 8.3 g/dL — ABNORMAL HIGH (ref 6.4–8.2)

## 2014-01-07 LAB — URINALYSIS, COMPLETE
BILIRUBIN, UR: NEGATIVE
GLUCOSE, UR: NEGATIVE mg/dL (ref 0–75)
Ketone: NEGATIVE
Leukocyte Esterase: NEGATIVE
Nitrite: NEGATIVE
PH: 6 (ref 4.5–8.0)
Protein: NEGATIVE
RBC,UR: <1 /HPF (ref 0–5)
Specific Gravity: 1.042 (ref 1.003–1.030)
WBC UR: 2 /HPF (ref 0–5)

## 2014-01-07 LAB — LIPASE, BLOOD: Lipase: 99 U/L (ref 73–393)

## 2014-01-07 LAB — LACTATE DEHYDROGENASE: LDH: 157 U/L (ref 81–246)

## 2014-01-08 ENCOUNTER — Ambulatory Visit: Payer: Self-pay | Admitting: Urology

## 2014-01-09 ENCOUNTER — Inpatient Hospital Stay: Payer: Self-pay | Admitting: Internal Medicine

## 2014-01-09 LAB — BASIC METABOLIC PANEL
Anion Gap: 5 — ABNORMAL LOW (ref 7–16)
BUN: 8 mg/dL (ref 7–18)
CALCIUM: 8.1 mg/dL — AB (ref 8.5–10.1)
CHLORIDE: 105 mmol/L (ref 98–107)
CO2: 30 mmol/L (ref 21–32)
Creatinine: 1.27 mg/dL (ref 0.60–1.30)
EGFR (African American): >60
EGFR (Non-African Amer.): 57 — ABNORMAL LOW
Glucose: 99 mg/dL (ref 65–99)
OSMOLALITY: 278 (ref 275–301)
Potassium: 4 mmol/L (ref 3.5–5.1)
Sodium: 140 mmol/L (ref 136–145)

## 2014-01-09 LAB — TROPONIN I: Troponin-I: <0.02 ng/mL

## 2014-01-09 LAB — URINE CULTURE

## 2014-01-16 ENCOUNTER — Other Ambulatory Visit (HOSPITAL_COMMUNITY): Payer: Self-pay | Admitting: Physician Assistant

## 2014-01-16 DIAGNOSIS — N12 Tubulo-interstitial nephritis, not specified as acute or chronic: Secondary | ICD-10-CM

## 2014-01-18 ENCOUNTER — Encounter (HOSPITAL_COMMUNITY): Payer: Self-pay

## 2014-01-18 ENCOUNTER — Ambulatory Visit (HOSPITAL_COMMUNITY)
Admission: RE | Admit: 2014-01-18 | Discharge: 2014-01-18 | Disposition: A | Discharge status: Home / Self Care | Payer: BC Managed Care – PPO | Source: Ambulatory Visit | Attending: Physician Assistant | Admitting: Physician Assistant

## 2014-01-18 DIAGNOSIS — N12 Tubulo-interstitial nephritis, not specified as acute or chronic: Secondary | ICD-10-CM

## 2014-01-18 MED ORDER — IOHEXOL 300 MG/ML  SOLN
80.0000 mL | Freq: Once | INTRAMUSCULAR | Status: AC | PRN
  Administered 2014-01-18: 80 mL via INTRAVENOUS

## 2014-05-10 ENCOUNTER — Encounter (HOSPITAL_COMMUNITY)
Admission: AD | Disposition: A | Discharge status: Home / Self Care | Payer: Self-pay | Source: Ambulatory Visit | Attending: Neurosurgery

## 2014-05-10 ENCOUNTER — Inpatient Hospital Stay (HOSPITAL_COMMUNITY)
Admission: AD | Admit: 2014-05-10 | Discharge: 2014-05-12 | DRG: 921 | Disposition: A | Discharge status: Home / Self Care | Payer: BC Managed Care – PPO | Source: Ambulatory Visit | Attending: Neurosurgery | Admitting: Neurosurgery

## 2014-05-10 ENCOUNTER — Encounter (HOSPITAL_COMMUNITY): Payer: Self-pay | Admitting: Neurological Surgery

## 2014-05-10 DIAGNOSIS — L24A9 Irritant contact dermatitis due friction or contact with other specified body fluids: Secondary | ICD-10-CM

## 2014-05-10 DIAGNOSIS — Z880 Allergy status to penicillin: Secondary | ICD-10-CM

## 2014-05-10 DIAGNOSIS — Z79899 Other long term (current) drug therapy: Secondary | ICD-10-CM

## 2014-05-10 DIAGNOSIS — T8130XA Disruption of wound, unspecified, initial encounter: Principal | ICD-10-CM | POA: Diagnosis present

## 2014-05-10 DIAGNOSIS — T148XXA Other injury of unspecified body region, initial encounter: Secondary | ICD-10-CM

## 2014-05-10 DIAGNOSIS — Z9889 Other specified postprocedural states: Secondary | ICD-10-CM

## 2014-05-10 LAB — CBC
HCT: 38 % (ref 36.0–46.0)
Hemoglobin: 12.4 g/dL (ref 12.0–15.0)
MCH: 28.2 pg (ref 26.0–34.0)
MCHC: 32.6 g/dL (ref 30.0–36.0)
MCV: 86.6 fL (ref 78.0–100.0)
Platelets: 249 10*3/uL (ref 150–400)
RBC: 4.39 MIL/uL (ref 3.87–5.11)
RDW: 13.3 % (ref 11.5–15.5)
WBC: 10.2 10*3/uL (ref 4.0–10.5)

## 2014-05-10 LAB — SURGICAL PCR SCREEN
MRSA, PCR: NEGATIVE
Staphylococcus aureus: NEGATIVE

## 2014-05-10 SURGERY — LUMBAR WOUND DEBRIDEMENT
Anesthesia: General | Laterality: Bilateral

## 2014-05-10 MED ORDER — SODIUM CHLORIDE 0.9 % IV SOLN
250.0000 mL | INTRAVENOUS | Status: DC
Start: 2014-05-10 — End: 2014-05-12
  Administered 2014-05-10: 250 mL via INTRAVENOUS

## 2014-05-10 MED ORDER — SODIUM CHLORIDE 0.9 % IV SOLN
INTRAVENOUS | Status: DC
  Administered 2014-05-10: 18:00:00 via INTRAVENOUS

## 2014-05-10 MED ORDER — ACETAMINOPHEN 650 MG RE SUPP: 650.0000 mg | RECTAL | Status: DC | PRN

## 2014-05-10 MED ORDER — ACETAMINOPHEN 325 MG PO TABS
650.0000 mg | ORAL_TABLET | ORAL | Status: DC | PRN
  Administered 2014-05-12: 650 mg via ORAL
  Filled 2014-05-10: qty 2

## 2014-05-10 MED ORDER — POTASSIUM CHLORIDE IN NACL 20-0.9 MEQ/L-% IV SOLN
INTRAVENOUS | Status: DC
Start: 2014-05-10 — End: 2014-05-12
  Administered 2014-05-10: 22:00:00 via INTRAVENOUS
  Filled 2014-05-10: qty 1000

## 2014-05-10 MED ORDER — ONDANSETRON HCL 4 MG/2ML IJ SOLN
4.0000 mg | INTRAMUSCULAR | Status: DC | PRN
  Administered 2014-05-10 – 2014-05-11 (×4): 4 mg via INTRAVENOUS
  Filled 2014-05-10 (×4): qty 2

## 2014-05-10 MED ORDER — SODIUM CHLORIDE 0.9 % IJ SOLN
3.0000 mL | Freq: Two times a day (BID) | INTRAMUSCULAR | Status: DC
  Administered 2014-05-10 – 2014-05-12 (×4): 3 mL via INTRAVENOUS

## 2014-05-10 MED ORDER — DIAZEPAM 5 MG PO TABS: 5.0000 mg | ORAL_TABLET | Freq: Four times a day (QID) | ORAL | Status: DC | PRN

## 2014-05-10 MED ORDER — HYDROMORPHONE HCL 1 MG/ML IJ SOLN
1.0000 mg | INTRAMUSCULAR | Status: DC | PRN
  Administered 2014-05-10 – 2014-05-11 (×6): 1 mg via INTRAVENOUS
  Filled 2014-05-10 (×6): qty 1

## 2014-05-10 MED ORDER — PHENOL 1.4 % MT LIQD: 1.0000 | OROMUCOSAL | Status: DC | PRN

## 2014-05-10 MED ORDER — HYDROMORPHONE HCL 2 MG PO TABS: 2.0000 mg | ORAL_TABLET | ORAL | Status: DC | PRN

## 2014-05-10 MED ORDER — MENTHOL 3 MG MT LOZG: 1.0000 | LOZENGE | OROMUCOSAL | Status: DC | PRN

## 2014-05-10 MED ORDER — SODIUM CHLORIDE 0.9 % IJ SOLN: 3.0000 mL | INTRAMUSCULAR | Status: DC | PRN

## 2014-05-10 SURGICAL SUPPLY — 39 items
BAG DECANTER FOR FLEXI CONT (MISCELLANEOUS) ×6 IMPLANT
BENZOIN TINCTURE PRP APPL 2/3 (GAUZE/BANDAGES/DRESSINGS) ×3 IMPLANT
CANISTER SUCT 3000ML (MISCELLANEOUS) ×3 IMPLANT
CLOSURE WOUND 1/2 X4 (GAUZE/BANDAGES/DRESSINGS) ×1
DRAPE LAPAROTOMY 100X72X124 (DRAPES) ×3 IMPLANT
DRAPE POUCH INSTRU U-SHP 10X18 (DRAPES) ×3 IMPLANT
DRSG OPSITE 4X5.5 SM (GAUZE/BANDAGES/DRESSINGS) ×3 IMPLANT
DRSG TELFA 3X8 NADH (GAUZE/BANDAGES/DRESSINGS) ×3 IMPLANT
DURAPREP 26ML APPLICATOR (WOUND CARE) IMPLANT
DURAPREP 6ML APPLICATOR 50/CS (WOUND CARE) IMPLANT
ELECT REM PT RETURN 9FT ADLT (ELECTROSURGICAL) ×3
ELECTRODE REM PT RTRN 9FT ADLT (ELECTROSURGICAL) ×1 IMPLANT
GAUZE SPONGE 4X4 16PLY XRAY LF (GAUZE/BANDAGES/DRESSINGS) IMPLANT
GLOVE BIO SURGEON STRL SZ8 (GLOVE) ×3 IMPLANT
GOWN STRL REUS W/ TWL LRG LVL3 (GOWN DISPOSABLE) IMPLANT
GOWN STRL REUS W/ TWL XL LVL3 (GOWN DISPOSABLE) IMPLANT
GOWN STRL REUS W/TWL 2XL LVL3 (GOWN DISPOSABLE) ×3 IMPLANT
GOWN STRL REUS W/TWL LRG LVL3 (GOWN DISPOSABLE)
GOWN STRL REUS W/TWL XL LVL3 (GOWN DISPOSABLE)
KIT BASIN OR (CUSTOM PROCEDURE TRAY) ×3 IMPLANT
KIT ROOM TURNOVER OR (KITS) ×3 IMPLANT
NEEDLE HYPO 18GX1.5 BLUNT FILL (NEEDLE) IMPLANT
NEEDLE HYPO 25X1 1.5 SAFETY (NEEDLE) ×3 IMPLANT
NEEDLE SPNL 20GX3.5 QUINCKE YW (NEEDLE) IMPLANT
NS IRRIG 1000ML POUR BTL (IV SOLUTION) ×3 IMPLANT
PACK LAMINECTOMY NEURO (CUSTOM PROCEDURE TRAY) ×3 IMPLANT
PAD ARMBOARD 7.5X6 YLW CONV (MISCELLANEOUS) ×9 IMPLANT
STRIP CLOSURE SKIN 1/2X4 (GAUZE/BANDAGES/DRESSINGS) ×2 IMPLANT
SUT VIC AB 0 CT1 18XCR BRD8 (SUTURE) ×1 IMPLANT
SUT VIC AB 0 CT1 8-18 (SUTURE) ×2
SUT VIC AB 2-0 CP2 18 (SUTURE) ×3 IMPLANT
SUT VIC AB 3-0 SH 8-18 (SUTURE) ×3 IMPLANT
SWAB COLLECTION DEVICE MRSA (MISCELLANEOUS) ×3 IMPLANT
SYR 20ML ECCENTRIC (SYRINGE) ×3 IMPLANT
SYR 3ML LL SCALE MARK (SYRINGE) IMPLANT
TOWEL OR 17X24 6PK STRL BLUE (TOWEL DISPOSABLE) ×3 IMPLANT
TOWEL OR 17X26 10 PK STRL BLUE (TOWEL DISPOSABLE) ×3 IMPLANT
TUBE ANAEROBIC SPECIMEN COL (MISCELLANEOUS) ×3 IMPLANT
WATER STERILE IRR 1000ML POUR (IV SOLUTION) ×3 IMPLANT

## 2014-05-10 NOTE — Progress Notes (Signed)
Pt arrived to 4N19 at current time.  Pt A&O x 4, c/o 8/10 lower back surgical pain, site covered with CDI gauze dressing, site leaking serosanguinous fluid.  Dressing changed, CHG bath given for potential I&D, surgical PCR done (negative)  Pt V/S taken, WNL, pt still on O2 from PACU, fluids running at 75 cc/hr.   Pt without distress.  Family at the bedside.

## 2014-05-10 NOTE — H&P (Signed)
Subjective: Patient is a 28 y.o. female who complains of drainage from her wound and increased back pain now 17 days status post L4-5 laminectomy by Dr. Jeral FruitBotero. She saw Dr. Jeral FruitBotero on Monday. Onset of symptoms was 6 days ago, gradually worsening since that time. The pain is rated severe, and is located  across the lower back. No leg pain or numbness tingling or weakness. The pain is described as aching and stabbing and occurs intermittently.  Symptoms are exacerbated by activity. She has had no fevers or chills. She called today and described her increasing drainage and described it as "pus" and therefore I had her directly admitted to 4 N. and put on the operative schedule for planned irrigation and debridement of lumbar wound as I suspected she had a wound infection. She has had no headache.  Past Medical History  Diagnosis Date  . PCOS (polycystic ovarian syndrome)     History reviewed. No pertinent past surgical history.  Allergies  Allergen Reactions  . Penicillins Anaphylaxis  . Hydrocodone Nausea And Vomiting  . Morphine And Related Itching and Nausea And Vomiting  . Oxycodone Itching and Nausea And Vomiting  . Tape Rash    Reaction to adhesive on patch used after breast surgery    History  Substance Use Topics  . Smoking status: Never Smoker   . Smokeless tobacco: Not on file  . Alcohol Use: No    History reviewed. No pertinent family history. Prior to Admission medications   Medication Sig Start Date End Date Taking? Authorizing Provider  gabapentin (NEURONTIN) 300 MG capsule  04/30/14   Historical Provider, MD     Review of Systems  Positive ROS: neg  All other systems have been reviewed and were otherwise negative with the exception of those mentioned in the HPI and as above.  Objective: Vital signs in last 24 hours: Temp:  [98.1 F (36.7 C)] 98.1 F (36.7 C) (11/27 1300) Pulse Rate:  [95] 95 (11/27 1300) BP: (125-131)/(80-103) 125/80 mmHg (11/27 1514) SpO2:  [100  %] 100 % (11/27 1300)  General Appearance: Alert, cooperative, no distress, appears stated age Head: Normocephalic, without obvious abnormality, atraumatic Eyes: PERRL, conjunctiva/corneas clear, EOM's intact     Neck: Supple, symmetrical, trachea midline Back: Symmetric, no curvature, ROM normal, no CVA tenderness, her incision is clean without evidence of swelling or induration or erythema, and there is drainage of serosanguineous liquid from the superior and inferior parts of the incision without evidence of pus or exudate, he has no tenderness even when squeezing or massaging the tissues to express the serosanguineous fluid. Lungs: respirations unlabored Heart: Regular rate and rhythm   NEUROLOGIC:   Mental status: alert and oriented, no aphasia, good attention span, Fund of knowledge/ memory ok Motor Exam - grossly normal Sensory Exam - grossly normal Reflexes: 1+ Coordination - grossly normal Gait - not tested Balance - not tested Cranial Nerves: I: smell Not tested  II: visual acuity  OS: na    OD: na  II: visual fields Full to confrontation  II: pupils Equal, round, reactive to light  III,VII: ptosis None  III,IV,VI: extraocular muscles  Full ROM  V: mastication Normal  V: facial light touch sensation  Normal  V,VII: corneal reflex  Present  VII: facial muscle function - upper  Normal  VII: facial muscle function - lower Normal  VIII: hearing Not tested  IX: soft palate elevation  Normal  IX,X: gag reflex Present  XI: trapezius strength  5/5  XI:  sternocleidomastoid strength 5/5  XI: neck flexion strength  5/5  XII: tongue strength  Normal    Data Review Lab Results  Component Value Date   WBC 11.5* 01/31/2011   HGB 14.1 01/31/2011   HCT 41.4 01/31/2011   MCV 79.3 01/31/2011   PLT 304 01/31/2011   Lab Results  Component Value Date   NA 140 01/31/2011   K 3.7 01/31/2011   CL 103 01/31/2011   CO2 25 01/31/2011   BUN 10 01/31/2011   CREATININE 0.69  01/31/2011   GLUCOSE 108* 01/31/2011   No results found for: INR, PROTIME  Assessment/Plan: 28 year old female who is 17 days status post lumbar laminectomy by suspected had a wound infection, her wound is quite clean with drainage of serosanguineous fluid only. It is certainly possible she has a deep infection but I would expect there to be some exudate at this point. I will expect her to have more tenderness to palpation. At this point I suspect this is simple serosanguineous drainage. The only thing that gives me concern is the fact that her pain is gotten worse over the last week and that the drainage continues so long after the surgery itself. However, we do see this from time to time. I do not think she should be put on antibiotics at this point. I placed 3 staples in the incision and this immediately stop the drainage for now. We will see if that continues or if it will start draining from other places or around the staples. For now think we should observe the wound, watch for the onset of fever, check some labs, and cancel her surgery for irrigation and debridement. I think the risk of such surgery assuming she does not indeed have a wound infection (general anesthesia, wound healing, risk of infection ) outweigh potential benefits. She seems pleased with this plan.   Marbeth Smedley S 05/10/2014 4:33 PM

## 2014-05-11 DIAGNOSIS — Z880 Allergy status to penicillin: Secondary | ICD-10-CM | POA: Diagnosis not present

## 2014-05-11 DIAGNOSIS — T8130XA Disruption of wound, unspecified, initial encounter: Secondary | ICD-10-CM | POA: Diagnosis present

## 2014-05-11 DIAGNOSIS — Z79899 Other long term (current) drug therapy: Secondary | ICD-10-CM | POA: Diagnosis not present

## 2014-05-11 DIAGNOSIS — Z9889 Other specified postprocedural states: Secondary | ICD-10-CM | POA: Diagnosis not present

## 2014-05-11 LAB — BASIC METABOLIC PANEL
Anion gap: 12 (ref 5–15)
BUN: 7 mg/dL (ref 6–23)
CO2: 22 mEq/L (ref 19–32)
Calcium: 8.5 mg/dL (ref 8.4–10.5)
Chloride: 103 mEq/L (ref 96–112)
Creatinine, Ser: 0.78 mg/dL (ref 0.50–1.10)
Glucose, Bld: 95 mg/dL (ref 70–99)
Potassium: 4.3 mEq/L (ref 3.7–5.3)
Sodium: 137 mEq/L (ref 137–147)

## 2014-05-11 LAB — SEDIMENTATION RATE: SED RATE: 49 mm/h — AB (ref 0–22)

## 2014-05-11 MED ORDER — VANCOMYCIN HCL IN DEXTROSE 1-5 GM/200ML-% IV SOLN
1000.0000 mg | Freq: Two times a day (BID) | INTRAVENOUS | Status: DC
  Administered 2014-05-12: 1000 mg via INTRAVENOUS
  Filled 2014-05-11 (×3): qty 200

## 2014-05-11 MED ORDER — VANCOMYCIN HCL 10 G IV SOLR
1500.0000 mg | Freq: Once | INTRAVENOUS | Status: AC
  Administered 2014-05-11: 1500 mg via INTRAVENOUS
  Filled 2014-05-11: qty 1500

## 2014-05-11 MED ORDER — KETOROLAC TROMETHAMINE 30 MG/ML IJ SOLN
30.0000 mg | Freq: Four times a day (QID) | INTRAMUSCULAR | Status: DC
  Administered 2014-05-11 (×2): 30 mg via INTRAVENOUS
  Filled 2014-05-11 (×4): qty 1

## 2014-05-11 MED ORDER — SULFAMETHOXAZOLE-TRIMETHOPRIM 400-80 MG PO TABS
1.0000 | ORAL_TABLET | Freq: Two times a day (BID) | ORAL | Status: DC
  Administered 2014-05-11 – 2014-05-12 (×3): 1 via ORAL
  Filled 2014-05-11 (×4): qty 1

## 2014-05-11 MED ORDER — DIPHENHYDRAMINE HCL 25 MG PO CAPS
50.0000 mg | ORAL_CAPSULE | Freq: Two times a day (BID) | ORAL | Status: DC | PRN
  Administered 2014-05-12: 50 mg via ORAL
  Filled 2014-05-11: qty 2

## 2014-05-11 MED ORDER — VANCOMYCIN HCL IN DEXTROSE 1-5 GM/200ML-% IV SOLN
1000.0000 mg | Freq: Two times a day (BID) | INTRAVENOUS | Status: DC
  Filled 2014-05-11: qty 200

## 2014-05-11 MED ORDER — SCOPOLAMINE 1 MG/3DAYS TD PT72
1.0000 | MEDICATED_PATCH | TRANSDERMAL | Status: DC
  Administered 2014-05-11: 1.5 mg via TRANSDERMAL
  Filled 2014-05-11: qty 1

## 2014-05-11 MED ORDER — INFLUENZA VAC SPLIT QUAD 0.5 ML IM SUSY
0.5000 mL | PREFILLED_SYRINGE | INTRAMUSCULAR | Status: DC
  Filled 2014-05-11: qty 0.5

## 2014-05-11 NOTE — Progress Notes (Signed)
UR completed 

## 2014-05-11 NOTE — Progress Notes (Signed)
Patient still c/o nausea, said the zofran is not helping. MD called to notify. Will continue to monitor.

## 2014-05-11 NOTE — Progress Notes (Addendum)
ANTIBIOTIC CONSULT NOTE - INITIAL  Pharmacy Consult for vancomycin Indication: surgical prophylaxis  Allergies  Allergen Reactions  . Penicillins Anaphylaxis  . Hydrocodone Nausea And Vomiting  . Morphine And Related Itching and Nausea And Vomiting  . Oxycodone Itching and Nausea And Vomiting  . Tape Rash    Reaction to adhesive on patch used after breast surgery    Patient Measurements: Height: 4\' 10"  (147.3 cm) Weight: 184 lb (83.462 kg) IBW/kg (Calculated) : 40.9 Adjusted Body Weight:   Vital Signs: Temp: 98.2 F (36.8 C) (11/28 1455) Temp Source: Oral (11/28 1455) BP: 114/70 mmHg (11/28 1455) Pulse Rate: 91 (11/28 1455) Intake/Output from previous day:   Intake/Output from this shift:    Labs:  Recent Labs  05/10/14 1818 05/11/14 1545  WBC 10.2  --   HGB 12.4  --   PLT 249  --   CREATININE  --  0.78   Estimated Creatinine Clearance: 94 mL/min (by C-G formula based on Cr of 0.78). No results for input(s): VANCOTROUGH, VANCOPEAK, VANCORANDOM, GENTTROUGH, GENTPEAK, GENTRANDOM, TOBRATROUGH, TOBRAPEAK, TOBRARND, AMIKACINPEAK, AMIKACINTROU, AMIKACIN in the last 72 hours.   Microbiology: Recent Results (from the past 720 hour(s))  Surgical pcr screen     Status: None   Collection Time: 05/10/14  1:43 PM  Result Value Ref Range Status   MRSA, PCR NEGATIVE NEGATIVE Final   Staphylococcus aureus NEGATIVE NEGATIVE Final    Comment:        The Xpert SA Assay (FDA approved for NASAL specimens in patients over 30 years of age), is one component of a comprehensive surveillance program.  Test performance has been validated by [facility] for patients greater than or equal to 30 year old. It is not intended to diagnose infection nor to guide or monitor treatment.     Medical History: Past Medical History  Diagnosis Date  . PCOS (polycystic ovarian syndrome)     Medications:  Anti-infectives    Start     Dose/Rate Route Frequency Ordered Stop   05/12/14  0200  vancomycin (VANCOCIN) IVPB 1000 mg/200 mL premix  Status:  Discontinued     1,000 mg200 mL/hr over 60 Minutes Intravenous Every 12 hours 05/11/14 1704 05/11/14 1706   05/11/14 1430  vancomycin (VANCOCIN) 1,500 mg in sodium chloride 0.9 % 500 mL IVPB     1,500 mg250 mL/hr over 120 Minutes Intravenous  Once 05/11/14 1336 05/11/14 1556   05/11/14 1330  sulfamethoxazole-trimethoprim (BACTRIM,SEPTRA) 400-80 MG per tablet 1 tablet     1 tablet Oral Every 12 hours 05/11/14 1319       Assessment: 28 yof presented with increased drainage from wound and increased back pain s/p recent L4-5 laminectomy. Pt is afebrile and WBC is WNL. Initial dose of 1500mg  was ordered while awaiting Scr to result. Pt was also started on bactrim per MD. Per RN report, vanc was stopped due to possible allergic reaction. RN is awaiting callback from MD for plan.  Goal of Therapy:  Vancomycin trough level 10-15 mcg/ml  Plan:  1. F/u antibiotic plan - if vancomycin to continue, start vanc 1gm IV Q12H  Dustie Brittle, Drake Leachachel Lynn 05/11/2014,5:12 PM  Addendum: MD ok with proceeding with vancomycin with benadryl pre-treatment. Continue with above plan.  Lysle Pearlachel Cherina Dhillon, PharmD, BCPS Pager # [phone] 05/11/2014 7:22 PM

## 2014-05-11 NOTE — Progress Notes (Signed)
Came to check on the patient and noted patient's face with rash and redness to the face. Patient just started an antibiotics. Iv antibiotics stopped and MD paged.

## 2014-05-11 NOTE — Progress Notes (Signed)
Lower back still uncomfortable. Still with some intermittent serosanguineous drainage. No frank purulence. No evidence of erythema around the wound edges. No fevers. No leukocytosis on CBC.  On examination she is awake and alert. She is oriented and appropriate. Straight leg raising negative bilaterally. Motor and sensory exam intact. Wound with a small area of superficial dehiscence at the base. No evidence of erythema. No evidence of deep wound space fluid collection. I can express a small amount of serosanguineous fluid with direct pressure.  Appearance most consistent with some superficial fat necrosis and draining seroma. Start on prophylactic antibiotics and continue to observe. No indication for formal I and D at this point. Doubt significant infection.

## 2014-05-11 NOTE — Progress Notes (Signed)
Called  the MD's office again regarding patient's rash and redness. Awaiting call back.

## 2014-05-11 NOTE — Progress Notes (Signed)
Patient c/o not feeling good and head hurting, writer offered pain medication and patient refused at this time.

## 2014-05-12 MED ORDER — SULFAMETHOXAZOLE-TRIMETHOPRIM 400-80 MG PO TABS: 1.0000 | ORAL_TABLET | Freq: Two times a day (BID) | ORAL | Status: DC

## 2014-05-12 MED ORDER — DOXYCYCLINE HYCLATE 50 MG PO CAPS: 100.0000 mg | ORAL_CAPSULE | Freq: Two times a day (BID) | ORAL | Status: DC

## 2014-05-12 NOTE — Progress Notes (Signed)
D/c instruction given to patient, verbalized understanding.

## 2014-05-12 NOTE — Discharge Summary (Signed)
Physician Discharge Summary  Patient ID: Robin MainlandSarah Taylor MRN: [id number] DOB/AGE: 11/17/1985 28 y.o.  Admit date: 05/10/2014 Discharge date: 05/12/2014  Admission Diagnoses:  Discharge Diagnoses:  Active Problems:   Wound drainage   Discharged Condition: good  Hospital Course: Patient admitted to hospital for evaluation of possible superficial wound infection. Patient admitted and found to have some intermittent serosanguineous discharge that evidence of marked purulence. No evidence of significant erythema or extreme tenderness. No evidence of deep wound space involvement. Situation felt to be secondary to superficial fat necrosis and some degree of resolving seroma draining through the skin. Patient advised take prophylactic antibiotics in continue with local wound care. She will follow up with her treating surgeon week.  Consults:   Significant Diagnostic Studies:   Treatments:   Discharge Exam: Blood pressure 97/57, pulse 68, temperature 98.4 F (36.9 C), temperature source Oral, resp. rate 17, height 4\' 10"  (1.473 m), weight 83.462 kg (184 lb), SpO2 99 %. Awake and alert. Oriented and appropriate. Wound healing well. Small amount of serous sanguinous discharge at the base. No superficial erythema. No fluctuance. Straight leg raising negative bilaterally. Motor and sensory exam intact. Disposition: 01-Home or Self Care     Medication List    TAKE these medications        doxycycline 50 MG capsule  Commonly known as:  VIBRAMYCIN  Take 2 capsules (100 mg total) by mouth 2 (two) times daily.     gabapentin 300 MG capsule  Commonly known as:  NEURONTIN  Take 300 mg by mouth 3 (three) times daily.     HYDROmorphone 4 MG tablet  Commonly known as:  DILAUDID  Take 4 mg by mouth every 4 (four) hours as needed for severe pain.     sulfamethoxazole-trimethoprim 400-80 MG per tablet  Commonly known as:  BACTRIM,SEPTRA  Take 1 tablet by mouth every 12 (twelve) hours.           Follow-up Information    Follow up with Karn CassisBOTERO,ERNESTO M, MD. Schedule an appointment as soon as possible for a visit in 3 days.   Specialty:  Neurosurgery   Contact information:   [address] [place] [address] [phone]       Signed: Temple PaciniOOL,Jazlynne Milliner A 05/12/2014, 1:04 PM

## 2014-05-12 NOTE — Progress Notes (Signed)
Patient is been d/c home. Condition stable.

## 2014-06-27 ENCOUNTER — Encounter (HOSPITAL_COMMUNITY): Payer: Self-pay | Admitting: Neurological Surgery

## 2014-08-31 ENCOUNTER — Emergency Department: Payer: Self-pay | Admitting: Physician Assistant

## 2014-09-05 ENCOUNTER — Emergency Department (HOSPITAL_COMMUNITY): Payer: 59

## 2014-09-05 ENCOUNTER — Emergency Department (HOSPITAL_COMMUNITY)
Admission: EM | Admit: 2014-09-05 | Discharge: 2014-09-05 | Disposition: A | Discharge status: Home / Self Care | Payer: 59 | Attending: Emergency Medicine | Admitting: Emergency Medicine

## 2014-09-05 ENCOUNTER — Encounter (HOSPITAL_COMMUNITY): Payer: Self-pay | Admitting: *Deleted

## 2014-09-05 DIAGNOSIS — Y9389 Activity, other specified: Secondary | ICD-10-CM | POA: Insufficient documentation

## 2014-09-05 DIAGNOSIS — Y998 Other external cause status: Secondary | ICD-10-CM | POA: Diagnosis not present

## 2014-09-05 DIAGNOSIS — S3992XA Unspecified injury of lower back, initial encounter: Secondary | ICD-10-CM | POA: Insufficient documentation

## 2014-09-05 DIAGNOSIS — W01198A Fall on same level from slipping, tripping and stumbling with subsequent striking against other object, initial encounter: Secondary | ICD-10-CM | POA: Insufficient documentation

## 2014-09-05 DIAGNOSIS — M545 Low back pain, unspecified: Secondary | ICD-10-CM

## 2014-09-05 DIAGNOSIS — Y9289 Other specified places as the place of occurrence of the external cause: Secondary | ICD-10-CM | POA: Diagnosis not present

## 2014-09-05 DIAGNOSIS — Z8639 Personal history of other endocrine, nutritional and metabolic disease: Secondary | ICD-10-CM | POA: Diagnosis not present

## 2014-09-05 DIAGNOSIS — Z880 Allergy status to penicillin: Secondary | ICD-10-CM | POA: Insufficient documentation

## 2014-09-05 DIAGNOSIS — S29001A Unspecified injury of muscle and tendon of front wall of thorax, initial encounter: Secondary | ICD-10-CM | POA: Diagnosis not present

## 2014-09-05 DIAGNOSIS — Z792 Long term (current) use of antibiotics: Secondary | ICD-10-CM | POA: Insufficient documentation

## 2014-09-05 DIAGNOSIS — Z720 Tobacco use: Secondary | ICD-10-CM | POA: Insufficient documentation

## 2014-09-05 DIAGNOSIS — Z79899 Other long term (current) drug therapy: Secondary | ICD-10-CM | POA: Diagnosis not present

## 2014-09-05 MED ORDER — IBUPROFEN 400 MG PO TABS: 400.0000 mg | ORAL_TABLET | Freq: Four times a day (QID) | ORAL | Status: DC | PRN

## 2014-09-05 MED ORDER — IBUPROFEN 800 MG PO TABS
800.0000 mg | ORAL_TABLET | Freq: Once | ORAL | Status: AC
  Administered 2014-09-05: 800 mg via ORAL
  Filled 2014-09-05: qty 1

## 2014-09-05 MED ORDER — HYDROMORPHONE HCL 2 MG PO TABS
2.0000 mg | ORAL_TABLET | Freq: Once | ORAL | Status: AC
  Administered 2014-09-05: 2 mg via ORAL
  Filled 2014-09-05: qty 1

## 2014-09-05 NOTE — Discharge Instructions (Signed)
Back Exercises Back exercises help treat and prevent back injuries. The goal of back exercises is to increase the strength of your abdominal and back muscles and the flexibility of your back. These exercises should be started when you no longer have back pain. Back exercises include:  Pelvic Tilt. Lie on your back with your knees bent. Tilt your pelvis until the lower part of your back is against the floor. Hold this position 5 to 10 sec and repeat 5 to 10 times.  Knee to Chest. Pull first 1 knee up against your chest and hold for 20 to 30 seconds, repeat this with the other knee, and then both knees. This may be done with the other leg straight or bent, whichever feels better.  Sit-Ups or Curl-Ups. Bend your knees 90 degrees. Start with tilting your pelvis, and do a partial, slow sit-up, lifting your trunk only 30 to 45 degrees off the floor. Take at least 2 to 3 seconds for each sit-up. Do not do sit-ups with your knees out straight. If partial sit-ups are difficult, simply do the above but with only tightening your abdominal muscles and holding it as directed.  Hip-Lift. Lie on your back with your knees flexed 90 degrees. Push down with your feet and shoulders as you raise your hips a couple inches off the floor; hold for 10 seconds, repeat 5 to 10 times.  Back arches. Lie on your stomach, propping yourself up on bent elbows. Slowly press on your hands, causing an arch in your low back. Repeat 3 to 5 times. Any initial stiffness and discomfort should lessen with repetition over time.  Shoulder-Lifts. Lie face down with arms beside your body. Keep hips and torso pressed to floor as you slowly lift your head and shoulders off the floor. Do not overdo your exercises, especially in the beginning. Exercises may cause you some mild back discomfort which lasts for a few minutes; however, if the pain is more severe, or lasts for more than 15 minutes, do not continue exercises until you see your caregiver.  Improvement with exercise therapy for back problems is slow.  See your caregivers for assistance with developing a proper back exercise program. Document Released: 07/08/2004 Document Revised: 08/23/2011 Document Reviewed: 04/01/2011 [facility] Patient Information 2015 [employer]. This information is not intended to replace advice given to you by your health care provider. Make sure you discuss any questions you have with your health care provider.   Please follow-up with your surgeon for further evaluation and management of chronic back pain. Please use ibuprofen as needed for pain. Experience new or worsening symptoms please return for further evaluation.

## 2014-09-05 NOTE — ED Provider Notes (Signed)
CSN: [id number]     Arrival date & time 09/05/14  1052 History   First MD Initiated Contact with Patient 09/05/14 1130     Chief Complaint  Patient presents with  . Fall    HPI   29 year old female presents today status post fall. Patient reports past medical history is significant for discectomy in November 2015 L4-L5. Patient reports this morning she was in the shower when she slipped falling backwards landing on her back and right ribs. She reports immediate pain at that time to her right lateral lower chest wall and right lower back. She denies radiation of symptoms, reports it's worse with deep inspirations, worse with movement. She denies shortness of breath, cough, but reports difficulty with deep inspiration due to pain. Patient reports a medical history of chronic lower back pain for which she is been treated with disc ectomy and followed up care with Dr. patient reports she uses Dilaudid at home for her pain as any other medication makes her ill on her stomach, but she reports she does not have any more at home. Patient denies using any medication before coming today. She denies radiation of symptoms down into her legs but reports occasional radiation from chronic back pain, No new symptoms. Denies loss of bowel or bladder functioning, decreased sensation, strength, or perfusion of her distal extremities.   Past Medical History  Diagnosis Date  . PCOS (polycystic ovarian syndrome)    Past Surgical History  Procedure Laterality Date  . Back surgery    . Cholecystectomy     History reviewed. No pertinent family history. History  Substance Use Topics  . Smoking status: Current Every Day Smoker -- 0.25 packs/day  . Smokeless tobacco: Not on file  . Alcohol Use: No   OB History    No data available     Review of Systems  All other systems reviewed and are negative.   Allergies  Penicillins; Hydrocodone; Morphine and related; Oxycodone; and Tape  Home Medications   Prior  to Admission medications   Medication Sig Start Date End Date Taking? Authorizing Provider  doxycycline (VIBRAMYCIN) 50 MG capsule Take 2 capsules (100 mg total) by mouth 2 (two) times daily. 05/12/14   Julio SicksHenry Pool, MD  gabapentin (NEURONTIN) 300 MG capsule Take 300 mg by mouth 3 (three) times daily.  04/30/14   Historical Provider, MD  HYDROmorphone (DILAUDID) 4 MG tablet Take 4 mg by mouth every 4 (four) hours as needed for severe pain.    Historical Provider, MD  sulfamethoxazole-trimethoprim (BACTRIM,SEPTRA) 400-80 MG per tablet Take 1 tablet by mouth every 12 (twelve) hours. 05/12/14   Julio SicksHenry Pool, MD   Ht 4\' 10"  (1.473 m)  Wt 185 lb (83.915 kg)  BMI 38.68 kg/m2  LMP 09/05/2014 Physical Exam  Constitutional: She is oriented to person, place, and time. She appears well-developed and well-nourished.  HENT:  Head: Normocephalic and atraumatic.  Eyes: Pupils are equal, round, and reactive to light.  Neck: Normal range of motion. Neck supple. No JVD present. No tracheal deviation present. No thyromegaly present.  Cardiovascular: Normal rate, regular rhythm, normal heart sounds and intact distal pulses.  Exam reveals no gallop and no friction rub.   No murmur heard. Pulmonary/Chest: Effort normal and breath sounds normal. No stridor. No respiratory distress. She has no wheezes. She has no rales. She exhibits no tenderness.  Tenderness to palpation of right lateral chest wall, no signs of injury, no rash  Abdominal: Soft. She exhibits no distension and  no mass. There is no tenderness. There is no rebound and no guarding.  Musculoskeletal: Normal range of motion.  Reduce hip flexion due to pain  Lymphadenopathy:    She has no cervical adenopathy.  Neurological: She is alert and oriented to person, place, and time. Coordination normal.  Skin: Skin is warm and dry.  Psychiatric: She has a normal mood and affect. Her behavior is normal. Judgment and thought content normal.  Nursing note and  vitals reviewed.   ED Course  Procedures (including critical care time) Labs Review Labs Reviewed - No data to display  Imaging Review No results found.   EKG Interpretation None      MDM   Final diagnoses:  Right-sided low back pain without sciatica    Imaging: DG thoracic, DG lumbar, DG ribs no abnormalities.   Therapeutics: Ibuprofen, Dilaudid  Assessment: Back pain  Plan: Patient showed no signs of trauma, diagnostic studies negative. Chronic low back pain exacerbated by fall today. Patient was instructed to follow-up with her surgeon who is managing her chronic low back pain. She was offered ibuprofen for which she refused, stated she was upset and feels that we didn't do nothing for her today. She expressed her feelings of multiple trips to the emergency room for back pain with no resolve "this is a wasted my time being here ""we all was wasted her time coming here ". Attempted counseling patient on therapies for low back pain/rehabilitation, she kept interrupting me and saying she was leaving. Patient removed her blood pressure cuff and left before discharge information could be given. She did not appear to be in any acute distress when getting off of the bed at the end of her visit.      Eyvonne Mechanic, PA-C 09/05/14 1744  Rolan Bucco, MD 09/06/14 0700

## 2014-09-05 NOTE — ED Notes (Signed)
Pt leaves without being d/c.

## 2014-09-05 NOTE — ED Notes (Signed)
Pt had a fall this morning while in the bathroom and fell onto the edge of the bathtub hitting the right side of her back. Pt had back surgery to her lumbar spine l4-l5 in November. No obvious bruising or deformity noted.

## 2014-09-05 NOTE — ED Notes (Signed)
Pt walked out without being d/c.

## 2014-10-04 ENCOUNTER — Other Ambulatory Visit: Payer: Self-pay | Admitting: Neurosurgery

## 2014-10-04 DIAGNOSIS — M5126 Other intervertebral disc displacement, lumbar region: Secondary | ICD-10-CM

## 2014-10-05 NOTE — Consult Note (Signed)
PATIENT NAME:  [name], [name] [id number] OF BIRTH:  [birth year] OF ADMISSION:  01/08/2014 PROVIDER: Dr. Clerance LavPadmaja Vasireddy FOR CONSULTATION: Abnormal Kidneys on CT scan, pyelonephritis OF PRESENT ILLNESS: [name] is a 29 YO Caucasian woman who was admitted last night with bilateral flank pain. She initially presented to the ED last Thursday with chest pain but her evaluation was entirely negative. She re-presented to the ED last night with persistent symptoms. Interestingly, she has never had any LUTS (dysuria, incontinence, nocturia, retention,etc) or hematuira. She has never had a hx of UTIs or nephrolithiasis. Her PMH is significant for PCOS but she is otherwise healthy. Labwork is largely negative. CT reveals hyperemic kidneys bilaterally with extenuated contrast uptake suggestive of acute bilateral pyelonephritis. Her UCx is negative to date. On my conversation with [name], she appears uncomfortable sitting in her bed and is about to eat lunch.  MEDICAL HISTORY: GallstonesPCOS SURGICAL HISTORY: Laparoscopic cholecystectomy 2013Breast biopsy HISTORY: significant for diabetes, PCOS, CAD. No history of GU malignancies.  HISTORY: 1/2 ppd x 12 year smoking history. Current smoker. No hx of ETOH or ellicit drug abuse. Works for [employer] Pen[employer] MEDICATIONS: PRNPRN INPATIENT MEDICATIONS:PRNPRNPRNLevaquin OF SYSTEMS: no fevers, chills, acute malaisechest pain last week, resolved nowno SOB, pleuritic chest pain, coughno diarrhea, constipation, regular bowel habits, no LUTS, no hematuria, + flank pain EXAM   28-Jul-15 05:03  Temperature Temperature (F) 97.8  Celsius 36.5  Pulse Pulse 84  Respirations Respirations 16  Systolic BP Systolic BP 103  Diastolic BP (mmHg) Diastolic BP (mmHg) 69  Mean BP 80  Pulse Ox % Pulse Ox % 97  Pulse Ox Activity Level  At rest  Oxygen Delivery 2L  AFVSSNAD, resting comfortably in bed, about to eat lunch, accompanied by husbandconversant, pleasant, appropriate mood &  affect, answers questions appropriatelyNCAT, EOMI, A&Ox3RRRnonlabored respirationssoft, ND, NT, no guarding/rebound, obese+CVA tenderness bilaterallyno c/c/e, mild swelling in ankles bilaterally but no pitting edema no LE, no nitritesnegative to dateWNLWNL, SCr 1.23 CT personally reviewed. Signs of acute pyelonephritis appreciated in kidneys bilaterally.  29 YO Caucasian woman with a hx of PCOS presenting with flank pain. Labs, UA, and UCx normal to date. CT personally reviewed and demonstrates signs of acute pyelonephritis. Flank pain persists and is managed by a combination of IV and PO medications. Treated for presumed pyelonephritis with IV levaquin.  Recommend repeat UCx if initial culture is negative. IVF, would trend SCr (0.93 last Thursday, 1.23 on admission last night), likely dehydratedWould broaden antibiotic coverage to include MRSA (IV vanc while in hospital, bactrim for home)Follow up in 6 weeks with renal ultrasound with PCP.  recommendations with Dr. Allena KatzPatel, Internal Medicine.  Moderate-Complex you for this interesting consult. It was a pleasure to partake in [name]'s care.   Electronic Signatures: Angelina PihKurpad, Tyquarius Paglia R (MD)  (Signed on 28-Jul-15 14:09)  Authored  Last Updated: 28-Jul-15 14:09 by Angelina PihKurpad, Gretel Cantu R (MD)

## 2014-10-05 NOTE — Discharge Summary (Signed)
PATIENT NAME:  Robin MainlandAYLOR, Tajuana MR#:  [id number] DATE OF BIRTH:  [birth year]  DATE OF ADMISSION:  01/08/2014 DATE OF DISCHARGE:  01/10/2014  PRESENTING COMPLAINT: Bilateral back pain and chest pain.   DISCHARGE DIAGNOSIS: Acute bilateral pyelonephritis.   VITALS: Stable.  CODE STATUS: FULL.  DISCHARGE MEDICATIONS: 1.  Diazepam 2 mg 1 tablet 4 times a day as needed.  2.  Tramadol 1 tablet every 4 hours as needed.  3.  Dilaudid 2 mg every 6 hours as needed.  4.  Levaquin 750 mg p.o. daily.  5.  Bactrim double strength 1 tablet b.i.d.   DISCHARGE INSTRUCTIONS: The patient advised to establish primary care. In case signs and symptoms worsen, she is recommended to come back to the Emergency Room or urgent care.   DIAGNOSTIC DATA: Urine culture negative. CBC within normal limits.   CT of the chest negative for PE.   CT of the abdomen and pelvis with contrast showed bilateral kidney areas that are triangular shaped and decreased attenuation highly suspicious for bilateral multifocal pyelonephritis. Status post cholecystectomy. No bowel obstruction. No hydronephrosis or hydroureter.   CONSULTANTS: Urology with Dr. Domenick BookbinderKurpad.  BRIEF SUMMARY OF HOSPITAL COURSE:  1.  Robin MainlandSarah Taylor is a 29 year old female with no past medical history who comes in with back pain and chest pain. She was admitted with bilateral acute pyelonephritis. Came in with back pain. No fever. UA looks okay. Urine culture negative. WBC count is in favor of infection, however, per urology will treat given CT positive findings and back pain and will treat with p.o. Levaquin and p.o. Bactrim since in rare circumstances MRSA urinary tract infection can cause bilateral pyelonephritis. The patient received IV p.r.n. Dilaudid and K-Pad for symptomatic pain management.  2.  Chest tightness, shortness of breath. The patient's cardiac enzymes remained negative. She remained in sinus rhythm. Chest CT was negative for PE.  3.  Nausea and vomiting.  P.r.n. antiemetics were given. The patient was tolerating p.o. diet prior to discharge.   Hospital stay otherwise remained stable. She remained a FULL code.   TIME SPENT: 40 minutes.   ____________________________ Wylie HailSona A. Allena KatzPatel, MD sap:sb D: 01/11/2014 07:50:52 ET T: 01/11/2014 08:32:00 ET JOB#: [id number]  cc: Reagan Klemz A. Allena KatzPatel, MD, <Dictator> Willow OraSONA A Elfriede Bonini MD ELECTRONICALLY SIGNED 01/23/2014 11:38

## 2014-10-05 NOTE — H&P (Signed)
PATIENT NAME:  [name], [name] MR#:  [id number] DATE OF BIRTH:  05-Oct-1985  DATE OF ADMISSION:  01/08/2014  PRIMARY CARE PHYSICIAN: None.   REFERRING PHYSICIAN: Dr. Jene Everyobert Kinner.   CHIEF COMPLAINT: Chest pain, back pain.   HISTORY OF PRESENT ILLNESS: [name] is a 29 year old female with a history of polycystic ovarian syndrome, obesity. Comes to the Emergency Department with complaint of chest pain. Started about 3 days back. The pain came down to under the breasts, into the back. Concerning this, came to the Emergency Department. Per Emergency Department physician, the patient was in severe pain. The patient denies having any fever, nausea or vomiting. The patient denies having any hematuria or dysuria. The patient came to the Emergency Department on the 23rd of July and the workup was completely unremarkable, including cardiac enzymes. CT abdomen and pelvis completely unremarkable. The patient was discharged home. Comes back again with worsening of the back pain. Again, workup in the Emergency Department with CT abdomen and pelvis. Bilateral kidneys are triangular shaped. Decreased attenuation. Highly suspicious bilateral multifocal pyelonephritis; however, the patient is afebrile. Does not have any elevated white blood cell count with a normal differential. UA is negative for nitrites and leukocyte esterase or any WBCs, negative for any RBCs. Neutrophils of 66%. LDH of 157. Concerning the CT findings, the Emergency Department physician gave levofloxacin. The patient also received multiple doses of morphine and Dilaudid with some improvement of the pain.   PAST MEDICAL HISTORY:  1. Polycystic ovarian syndrome.  2. Breast biopsy.  3. Cholecystectomy.   ALLERGIES: PENICILLIN.   HOME MEDICATIONS:  1. Tramadol 50 mg every 4 hours as needed.  2. Diazepam 2 mg 4 times a day.   SOCIAL HISTORY: Smokes 1/2 pack a day. Denies drinking alcohol or using illicit drugs. Married, lives with her husband and  children. Works for [employer]   FAMILY HISTORY: Diabetes mellitus and heart disease.   REVIEW OF SYSTEMS:  CONSTITUTIONAL: Denies any generalized weakness.  EYES: No change in vision.  EARS, NOSE, THROAT: No change in hearing.  RESPIRATORY: No cough, shortness of breath.  CARDIOVASCULAR: No chest pain, palpations.  GASTROINTESTINAL: No nausea, vomiting, abdominal pain.  GENITOURINARY: No dysuria or hematuria.  HEMATOLOGIC: No easy bruising or bleeding.  SKIN: No rash or lesions.  MUSCULOSKELETAL: No joint pains and aches.  NEUROLOGIC: No weakness or numbness in any part of the body.   PHYSICAL EXAMINATION:  GENERAL: This is a well-built, well-nourished, age-appropriate female lying down in the bed, not in distress.  VITAL SIGNS: Temperature 97.9, pulse 88, blood pressure 122/90, respiratory rate of 18, oxygen saturation is 94% on room air.  HEENT: Head normocephalic, atraumatic. There is no scleral icterus. Conjunctivae normal. Pupils equal and react to light. Extraocular movements are intact. Mucous membranes moist. No pharyngeal erythema.  NECK: Supple. No lymphadenopathy. No JVD. No carotid bruit.  CHEST: Has focal tenderness in the mid sternum area and under bilateral breasts, epigastric area and left upper quadrant. Mild tenderness to palpation. No CVA tenderness. Some tenderness under the subscapular area. Very nonspecific areas.  LUNGS: Bilaterally clear to auscultation.  HEART: S1, S2 regular. No murmurs are heard.  ABDOMEN: Bowel sounds present. Soft. Has mild tenderness in the epigastric area. No rebound or guarding. Could not appreciate any hepatosplenomegaly.  EXTREMITIES: No pedal edema. Pulses 2+.  SKIN: No rash or lesions.  MUSCULOSKELETAL: Good range of motion in all of the extremities.  NEUROLOGIC: The patient is alert, oriented to place, person and  time. Cranial nerves II through XII intact. Motor 5/5 in upper and lower extremities.   LABORATORIES: CBC and CMP panel  are completely within normal limits. UA negative for nitrites and leukocyte esterase. CT abdomen and pelvis as mentioned above. Multifocal pyelonephritis. Urine drug screen is positive for opiates.   ASSESSMENT AND PLAN: [name] is a 29 year old female who comes with back and chest pain.  1. Back pain: This seems to be more of a musculoskeletal pain. The CT findings of the kidneys seems to be more of an incidental finding. The patient does not have any abnormality on urinalysis despite showing the multiple areas of pyelonephritis; however, concern about the underlying process involving the kidneys. Will obtain MRI of the kidneys. Will also obtain urology consult for their opinion. Will hold the antibiotics for now. Unlikely this is infection. Normal LDH.  2. Obesity: The patient will need further counseling.  3. Keep the patient on deep vein thrombosis prophylaxis with Lovenox.   TIME SPENT: 45 minutes.   ____________________________ Susa Griffins, MD pv:gb D: 01/08/2014 00:12:41 ET T: 01/08/2014 00:40:57 ET JOB#: [id number]  cc: Susa Griffins, MD, <Dictator> Susa Griffins MD ELECTRONICALLY SIGNED 01/17/2014 21:10

## 2014-10-05 NOTE — Consult Note (Signed)
Chief Complaint:  Subjective/Chief Complaint Presumed pyelonephritis  Subjective: patient resting in bed, appears comfortable. PO intake has been scant. had an episode of CP this AM and has been ruled out for PE with CT chest angio.   VITAL SIGNS/ANCILLARY NOTES:  **Vital Signs.:   29-Jul-15 13:41  Vital Signs Type Post Fall  Temperature Temperature (F) 98.5  Celsius 36.9  Temperature Source oral  Pulse Pulse 83  Respirations Respirations 18  Systolic BP Systolic BP 106  Diastolic BP (mmHg) Diastolic BP (mmHg) 71  Mean BP 82  Pulse Ox % Pulse Ox % 94  Pulse Ox Activity Level  At rest  Oxygen Delivery Room Air/ 21 %  *Intake and Output.:   Daily 29-Jul-15 07:00  Grand Totals Intake:  537 Output:      Net:  537 24 Hr.:  537    11:10  Grand Totals Intake:  0 Output:  0    Net:  0 24 Hr.:  0    12:00  Grand Totals Intake:  0 Output:  0    Net:  0 24 Hr.:  0    15:47  Grand Totals Intake:  1547 Output:      Net:  1547 24 Hr.:  1547    Shift 23:00  Grand Totals Intake:  1547 Output:      Net:  1547 24 Hr.:  1547   Brief Assessment:  GEN well developed, well nourished, no acute distress   Cardiac Regular   Respiratory normal resp effort   Gastrointestinal Normal   Gastrointestinal details normal Soft  Nontender  Nondistended  obese   EXTR negative cyanosis/clubbing   Additional Physical Exam GU: mild CVA tenderness Walton > L, voiding with no issues Psych: appropriate mood and affect, A&Ox3, conversant, answers questions appropriately Neuro: A&Ox3, NCAT, EOMI   Assessment/Plan:  Assessment/Plan:  Assessment 29 YO woman with presumed pyelonephritis based on appearence of kidneys on CT scan. UA, UCx, and labs largely within normal limits. She has been AFVSS for the entirety of her hospital admission. On broad spectrum IV ABx given poor PO intake as well as a combination of IV and PO pain management for her flank pain. Ongoing issues with CP addressed by primary  team.   Plan Recommendations:  1. Pyelonephritis: a small portion of patients with radiographic evidence of pyelonephritis can have negative urine culture. Despite relatively normal labs, negative culture, and normal vitals, patient has a presumed diagnosis of pyelonephritis given persistent flank pain, CT scan findings, and persistent n/v and generalized malaise. Would cont IV ABx while inhouse, would treat with 14d course. Transition to PO when tolerated to cover routine GN bacteria as well as MRSA (Bactrim).   2. Patient inquired about "lung collapse" on CT, informed re: atelectasis and requested nurse provided incentive spirometer. Also recommended daily OOB and ambulation. Primary team to follow further.   3. If no improvement in 48h or if clinical signs (fever) concerning for sepsis, would recommend repeat UCx and repeat CT with IV contrast to examine for perinephric or renal abscess.   Thank you for this interesting consult. It was a pleasure to partake in [name]'s care.   Robin Beringaj Tyris Eliot, MD Urologic Surgery   Electronic Signatures: Angelina PihKurpad, [name] (MD)  (Signed 29-Jul-15 16:20)  Authored: Chief Complaint, VITAL SIGNS/ANCILLARY NOTES, Brief Assessment, Assessment/Plan   Last Updated: 29-Jul-15 16:20 by Angelina PihKurpad, [name] (MD)

## 2014-10-08 ENCOUNTER — Ambulatory Visit
Admission: RE | Admit: 2014-10-08 | Discharge: 2014-10-08 | Disposition: A | Discharge status: Home / Self Care | Payer: 59 | Source: Ambulatory Visit | Attending: Neurosurgery | Admitting: Neurosurgery

## 2014-10-08 VITALS — BP 112/73 | HR 75

## 2014-10-08 DIAGNOSIS — M5126 Other intervertebral disc displacement, lumbar region: Secondary | ICD-10-CM

## 2014-10-08 DIAGNOSIS — T148XXA Other injury of unspecified body region, initial encounter: Secondary | ICD-10-CM

## 2014-10-08 DIAGNOSIS — L24A9 Irritant contact dermatitis due friction or contact with other specified body fluids: Secondary | ICD-10-CM

## 2014-10-08 MED ORDER — METHYLPREDNISOLONE ACETATE 40 MG/ML INJ SUSP (RADIOLOG
120.0000 mg | Freq: Once | INTRAMUSCULAR | Status: AC
  Administered 2014-10-08: 120 mg via EPIDURAL

## 2014-10-08 MED ORDER — IOHEXOL 180 MG/ML  SOLN
1.0000 mL | Freq: Once | INTRAMUSCULAR | Status: AC | PRN
  Administered 2014-10-08: 1 mL via EPIDURAL

## 2014-10-08 NOTE — Discharge Instructions (Signed)

## 2015-02-13 HISTORY — PX: BREAST SURGERY: SHX581

## 2015-06-02 ENCOUNTER — Other Ambulatory Visit: Payer: Self-pay | Admitting: Anesthesiology

## 2015-07-03 ENCOUNTER — Ambulatory Visit: Admit: 2015-07-03 | Payer: Self-pay | Admitting: Anesthesiology

## 2015-07-03 SURGERY — INSERTION, SPINAL CORD STIMULATOR, LUMBAR
Anesthesia: Monitor Anesthesia Care

## 2015-07-15 ENCOUNTER — Other Ambulatory Visit: Payer: Self-pay | Admitting: Anesthesiology

## 2015-07-25 ENCOUNTER — Encounter (HOSPITAL_COMMUNITY)
Admission: RE | Admit: 2015-07-25 | Discharge: 2015-07-25 | Disposition: A | Discharge status: Home / Self Care | Payer: BLUE CROSS/BLUE SHIELD | Source: Ambulatory Visit | Attending: Anesthesiology | Admitting: Anesthesiology

## 2015-07-25 ENCOUNTER — Encounter (HOSPITAL_COMMUNITY): Payer: Self-pay

## 2015-07-25 DIAGNOSIS — Z01812 Encounter for preprocedural laboratory examination: Secondary | ICD-10-CM | POA: Insufficient documentation

## 2015-07-25 DIAGNOSIS — M961 Postlaminectomy syndrome, not elsewhere classified: Secondary | ICD-10-CM | POA: Insufficient documentation

## 2015-07-25 HISTORY — DX: Renal tubulo-interstitial disease, unspecified: N15.9

## 2015-07-25 HISTORY — DX: Other pulmonary collapse: J98.19

## 2015-07-25 LAB — HCG, SERUM, QUALITATIVE: PREG SERUM: NEGATIVE

## 2015-07-25 LAB — CBC
HEMATOCRIT: 43.4 % (ref 36.0–46.0)
HEMOGLOBIN: 14.2 g/dL (ref 12.0–15.0)
MCH: 27.2 pg (ref 26.0–34.0)
MCHC: 32.7 g/dL (ref 30.0–36.0)
MCV: 83.1 fL (ref 78.0–100.0)
Platelets: 263 10*3/uL (ref 150–400)
RBC: 5.22 MIL/uL — AB (ref 3.87–5.11)
RDW: 13.9 % (ref 11.5–15.5)
WBC: 10.4 10*3/uL (ref 4.0–10.5)

## 2015-07-25 LAB — SURGICAL PCR SCREEN
MRSA, PCR: NEGATIVE
Staphylococcus aureus: NEGATIVE

## 2015-07-25 NOTE — Pre-Procedure Instructions (Signed)
    [name]  07/25/2015      [id number] - [place], [place] - [address] [place] [address] AT [facility] [address] [place] [address] [place] [address] Phone: [phone] Fax: [phone]    Your procedure is scheduled on Friday, August 01, 2015  Report to [facility] Admitting at 7:40 A.M.  Call this number if you have problems the morning of surgery:  [phone]   Remember:  Do not eat food or drink liquids after midnight Thursday, July 31, 2015  Take these medicines the morning of surgery with A SIP OF WATER : none  Stop taking Aspirin, vitamins, fish oil and herbal medications. Do not take any NSAIDs ie: Ibuprofen, Advil, Naproxen, BC and Goody Powder or any medication containing Aspirin; stop Saturday, July 26, 2015   Do not wear jewelry, make-up or nail polish.  Do not wear lotions, powders, or perfumes.  You may not wear deodorant.  Do not shave 48 hours prior to surgery.   Do not bring valuables to the hospital.  [facility] is not responsible for any belongings or valuables.  Contacts, dentures or bridgework may not be worn into surgery.  Leave your suitcase in the car.  After surgery it may be brought to your room.  For patients admitted to the hospital, discharge time will be determined by your treatment team.  Patients discharged the day of surgery will not be allowed to drive home.   Name and phone number of your driver:  Special instructions: Shower the night before surgery and the morning of surgery with CHG.  Please read over the following fact sheets that you were given. Pain Booklet, Coughing and Deep Breathing, MRSA Information and Surgical Site Infection Prevention

## 2015-07-25 NOTE — Progress Notes (Signed)
PCP: [facility]

## 2015-08-04 ENCOUNTER — Other Ambulatory Visit: Payer: Self-pay

## 2015-08-04 DIAGNOSIS — Z9889 Other specified postprocedural states: Secondary | ICD-10-CM

## 2015-08-06 ENCOUNTER — Encounter (HOSPITAL_BASED_OUTPATIENT_CLINIC_OR_DEPARTMENT_OTHER): Payer: Self-pay | Admitting: Emergency Medicine

## 2015-08-06 ENCOUNTER — Emergency Department
Admission: EM | Admit: 2015-08-06 | Discharge: 2015-08-06 | Disposition: A | Discharge status: Home / Self Care | Payer: BLUE CROSS/BLUE SHIELD | Attending: Emergency Medicine | Admitting: Emergency Medicine

## 2015-08-06 ENCOUNTER — Emergency Department (HOSPITAL_BASED_OUTPATIENT_CLINIC_OR_DEPARTMENT_OTHER)
Admission: EM | Admit: 2015-08-06 | Discharge: 2015-08-06 | Disposition: A | Discharge status: Home / Self Care | Payer: BLUE CROSS/BLUE SHIELD | Attending: Emergency Medicine | Admitting: Emergency Medicine

## 2015-08-06 DIAGNOSIS — Z8639 Personal history of other endocrine, nutritional and metabolic disease: Secondary | ICD-10-CM | POA: Insufficient documentation

## 2015-08-06 DIAGNOSIS — Z8709 Personal history of other diseases of the respiratory system: Secondary | ICD-10-CM | POA: Diagnosis not present

## 2015-08-06 DIAGNOSIS — F17210 Nicotine dependence, cigarettes, uncomplicated: Secondary | ICD-10-CM | POA: Insufficient documentation

## 2015-08-06 DIAGNOSIS — F419 Anxiety disorder, unspecified: Secondary | ICD-10-CM | POA: Insufficient documentation

## 2015-08-06 DIAGNOSIS — N611 Abscess of the breast and nipple: Secondary | ICD-10-CM | POA: Insufficient documentation

## 2015-08-06 DIAGNOSIS — Z880 Allergy status to penicillin: Secondary | ICD-10-CM | POA: Insufficient documentation

## 2015-08-06 DIAGNOSIS — Z87448 Personal history of other diseases of urinary system: Secondary | ICD-10-CM | POA: Insufficient documentation

## 2015-08-06 DIAGNOSIS — F329 Major depressive disorder, single episode, unspecified: Secondary | ICD-10-CM | POA: Diagnosis not present

## 2015-08-06 DIAGNOSIS — N644 Mastodynia: Secondary | ICD-10-CM | POA: Diagnosis present

## 2015-08-06 DIAGNOSIS — Z79899 Other long term (current) drug therapy: Secondary | ICD-10-CM | POA: Diagnosis not present

## 2015-08-06 MED ORDER — LIDOCAINE-EPINEPHRINE 2 %-1:100000 IJ SOLN
20.0000 mL | Freq: Once | INTRAMUSCULAR | Status: DC
  Filled 2015-08-06: qty 1

## 2015-08-06 MED ORDER — ONDANSETRON 8 MG PO TBDP: 8.0000 mg | ORAL_TABLET | Freq: Three times a day (TID) | ORAL | Status: DC | PRN

## 2015-08-06 MED ORDER — HYDROMORPHONE HCL 2 MG PO TABS: 2.0000 mg | ORAL_TABLET | ORAL | Status: DC | PRN

## 2015-08-06 MED ORDER — CLINDAMYCIN HCL 150 MG PO CAPS
300.0000 mg | ORAL_CAPSULE | Freq: Once | ORAL | Status: AC
  Administered 2015-08-06: 300 mg via ORAL
  Filled 2015-08-06: qty 2

## 2015-08-06 MED ORDER — ONDANSETRON 8 MG PO TBDP
8.0000 mg | ORAL_TABLET | Freq: Once | ORAL | Status: AC
  Administered 2015-08-06: 8 mg via ORAL
  Filled 2015-08-06: qty 1

## 2015-08-06 MED ORDER — CLINDAMYCIN HCL 300 MG PO CAPS: 300.0000 mg | ORAL_CAPSULE | Freq: Four times a day (QID) | ORAL | Status: DC

## 2015-08-06 MED ORDER — FENTANYL CITRATE (PF) 100 MCG/2ML IJ SOLN
200.0000 ug | Freq: Once | INTRAMUSCULAR | Status: AC
Start: 2015-08-06 — End: 2015-08-06
  Administered 2015-08-06: 200 ug via INTRAMUSCULAR
  Filled 2015-08-06: qty 4

## 2015-08-06 NOTE — ED Provider Notes (Addendum)
CSN: [id number]     Arrival date & time 08/06/15  0220 History   First MD Initiated Contact with Patient 08/06/15 0406     Chief Complaint  Patient presents with  . Breast Problem     (Consider location/radiation/quality/duration/timing/severity/associated sxs/prior Treatment) HPI  This is a 30 year old female with a history of a right breast lumpectomy several months ago for what was thought to be a tumor but was in fact an abscess. She is here with a three-day history of pain, erythema and tenderness of the right breast at the site of the previous incision. This site is just medial to the right nipple. Pain is moderate to severe, worse with movement or palpation. She was placed on Bactrim 2 days ago but symptoms have worsened.  Past Medical History  Diagnosis Date  . PCOS (polycystic ovarian syndrome)   . Depression   . Anxiety   . Kidney infection 7/15  . Lung collapse 7/15    both lungs collapse, no symptoms,,spontanous([facility])   Past Surgical History  Procedure Laterality Date  . Back surgery    . Cholecystectomy    . Breast surgery Right 9/16    lumpectomy   No family history on file. Social History  Substance Use Topics  . Smoking status: Light Tobacco Smoker -- 0.25 packs/day for 13 years    Types: Cigarettes  . Smokeless tobacco: None  . Alcohol Use: No   OB History    No data available     Review of Systems  All other systems reviewed and are negative.   Allergies  Penicillins; Morphine and related; Oxycodone; Hydrocodone; and Tape  Home Medications   Prior to Admission medications   Medication Sig Start Date End Date Taking? Authorizing Provider  Sulfamethoxazole-Trimethoprim (BACTRIM PO) Take by mouth.   Yes Historical Provider, MD  DULoxetine (CYMBALTA) 60 MG capsule Take 60 mg by mouth daily. 07/11/15   Historical Provider, MD  ibuprofen (ADVIL,MOTRIN) 400 MG tablet Take 1 tablet (400 mg total) by mouth every 6 (six) hours as  needed. 09/05/14   Eyvonne Mechanic, PA-C   BP 112/89 mmHg  Pulse 95  Temp(Src) 98.2 F (36.8 C) (Oral)  Resp 18  Ht  (1.448 m)  Wt 180 lb (81.647 kg)  BMI 38.94 kg/m2  SpO2 100%  LMP 07/05/2015   Physical Exam  General: Well-developed, well-nourished female in no acute distress; appearance consistent with age of record HENT: normocephalic; atraumatic Eyes: Normal appearance Neck: supple Heart: regular rate and rhythm Lungs: Normal respiratory effort and excursion Breasts: Tender, erythematous, fluctuant region just medial to the right areola Abdomen: soft; nondistended Extremities: No deformity; full range of motion Neurologic: Awake, alert and oriented; motor function intact in all extremities and symmetric; no facial droop Skin: Warm and dry Psychiatric: Normal mood and affect    ED Course  Procedures (including critical care time)  INCISION AND DRAINAGE Performed by: Paula Libra L Consent: Verbal consent obtained. Risks and benefits: risks, benefits and alternatives were discussed Type: abscess  Body area: Right breast  Anesthesia: local infiltration  Incision was made with a scalpel along previous incision site.  Local anesthetic: lidocaine 2 % with epinephrine  Anesthetic total: 3 ml  Complexity: complex Blunt dissection to break up loculations  Drainage: purulent  Drainage amount: Moderate, foul-smelling  Packing material: 1/2 in iodoform gauze  Patient tolerance: Patient tolerated the procedure well with no immediate complications.     MDM  We'll switch patient to clindamycin given lack  of improvement with Bactrim and foul-smelling nature of pus suggesting anaerobic rather than MRSA infection. Advised that a probiotic supplement may help prevent diarrhea.   Paula Libra, MD 08/06/15 1610  Paula Libra, MD 08/06/15 9604  Paula Libra, MD 08/06/15 [phone]

## 2015-08-06 NOTE — ED Notes (Signed)
Pt's I&D site covered with gauze by Boneta Lucks, RT.

## 2015-08-06 NOTE — ED Notes (Signed)
MD at bedside. 

## 2015-08-06 NOTE — ED Notes (Signed)
Pt seen at [facility] yesterday for "an infection in my right breast". Pt place on antibiotics. Pt reports the pain has gotten worse

## 2015-08-06 NOTE — ED Notes (Signed)
Pt had lumpectomy to right breast at [facility] in September 2016. Was found to be an abscess and not Cancer. Did fine until 2 days ago. Area got red and painful.  She called her Dr. At [facility] Monday morning and was called in rx for Bactrim.  She has been taking it as prescribed but it is getting much worse.  Redness and pain both spreading.

## 2015-08-07 ENCOUNTER — Telehealth (HOSPITAL_BASED_OUTPATIENT_CLINIC_OR_DEPARTMENT_OTHER): Payer: Self-pay | Admitting: *Deleted

## 2015-08-07 NOTE — ED Notes (Signed)
Pt called per her request. Wound care discussed. Advised to f/u with PCP tomorrow for wound check.

## 2015-08-20 ENCOUNTER — Encounter (HOSPITAL_COMMUNITY): Payer: Self-pay | Admitting: *Deleted

## 2015-08-20 NOTE — Progress Notes (Signed)
Pt denies SOB, chest pain, and being under the care of a cardiologist. Pt denies having a stress test, echo and cardiac cath.  Pt denies having an EKG within the last year. Pt stated that MD is aware that she was treated for a right breast abscess, pt stated, " it is healed." Pt made aware to stop taking Aspirin, vitamins, fish oil and herbal medications. Do not take any NSAIDs ie: Ibuprofen, Advil, Naproxen or any medication containing Aspirin. Pt stated, I still have my instructions from before and my wash, that I never used.pt instructed to wash with CHG tomorrow night and DOS as instructed. Pt verbalized understanding of all pre-op instructions.

## 2015-08-21 MED ORDER — VANCOMYCIN HCL IN DEXTROSE 1-5 GM/200ML-% IV SOLN
1000.0000 mg | INTRAVENOUS | Status: AC
  Administered 2015-08-22: 1000 mg via INTRAVENOUS
  Filled 2015-08-21: qty 200

## 2015-08-21 NOTE — Anesthesia Preprocedure Evaluation (Addendum)
Anesthesia Evaluation  Patient identified by MRN, date of birth, ID band Patient awake    Reviewed: Allergy & Precautions, Patient's Chart, lab work & pertinent test results  History of Anesthesia Complications Negative for: history of anesthetic complications  Airway Mallampati: III  TM Distance: >3 FB Neck ROM: Full    Dental  (+) Dental Advisory Given, Poor Dentition, Chipped   Pulmonary Current Smoker,    Pulmonary exam normal        Cardiovascular negative cardio ROS Normal cardiovascular exam     Neuro/Psych PSYCHIATRIC DISORDERS Anxiety Depression negative neurological ROS     GI/Hepatic   Endo/Other  Morbid obesity  Renal/GU Renal disease     Musculoskeletal   Abdominal   Peds  Hematology   Anesthesia Other Findings   Reproductive/Obstetrics                          Anesthesia Physical Anesthesia Plan  ASA: II  Anesthesia Plan: MAC   Post-op Pain Management:    Induction:   Airway Management Planned: Simple Face Mask  Additional Equipment:   Intra-op Plan:   Post-operative Plan:   Informed Consent: I have reviewed the patients History and Physical, chart, labs and discussed the procedure including the risks, benefits and alternatives for the proposed anesthesia with the patient or authorized representative who has indicated his/her understanding and acceptance.   Dental advisory given  Plan Discussed with: Anesthesiologist and CRNA  Anesthesia Plan Comments:        Anesthesia Quick Evaluation

## 2015-08-21 NOTE — H&P (Signed)
[name] is an 30 y.o. female.   Chief Complaint: back pain, radiation to the lower extremities HPI: She has a history of a previous bilateral L4 L5 discectomy, but unfortunately continues to have back and lower extremity pain.  She has trialed multiple medications as well as physical therapy without any improvement.  She has been utilizing Dilaudid for pain and and was trialed on gabapentin, Lyrica, and Cymbalta with either side effects or lack of efficacy.  She reports no change in her pain, her depression, but no side effects either.  He discontinued essentially all her medications.    She did attend class in our office  about the spinal cord stimulator, and after appropriate psychological evaluation, underwent a very successful trial with ~70% reduction in symptoms.   Past Medical History  Diagnosis Date  . PCOS (polycystic ovarian syndrome)   . Depression   . Anxiety   . Kidney infection 7/15  . Lung collapse 7/15    both lungs collapse, no symptoms,,spontanous([facility])  . Anemia     as a child  . Abscess     right breast    Past Surgical History  Procedure Laterality Date  . Back surgery    . Cholecystectomy    . Breast surgery Right 9/16    lumpectomy    Family History  Problem Relation Age of Onset  . Diabetes Mother   . Heart disease Mother   . COPD Mother   . Cancer Brother   . Bipolar disorder Brother   . Schizophrenia Brother   . Alzheimer's disease Other   . Dementia Other    Social History:  reports that she has been smoking Cigarettes.  She has a 3.25 pack-year smoking history. She has never used smokeless tobacco. She reports that she does not drink alcohol or use illicit drugs.  Allergies:  Allergies  Allergen Reactions  . Penicillins Anaphylaxis    Has patient had a PCN reaction causing immediate rash, facial/tongue/throat swelling, SOB or lightheadedness with hypotension: Yes Has patient had a PCN reaction causing severe rash  involving mucus membranes or skin necrosis: No Has patient had a PCN reaction that required hospitalization No Has patient had a PCN reaction occurring within the last 10 years: No If all of the above answers are "NO", then may proceed with Cephalosporin use.   Marland Kitchen Morphine And Related Itching and Nausea And Vomiting  . Oxycodone Itching and Nausea And Vomiting  . Hydrocodone Nausea And Vomiting  . Silver Rash  . Tape Rash    Reaction to adhesive on patch used after breast surgery    Medications Prior to Admission  Medication Sig Dispense Refill  . clindamycin (CLEOCIN) 300 MG capsule Take 1 capsule (300 mg total) by mouth 4 (four) times daily. X 7 days 28 capsule 0  . ondansetron (ZOFRAN ODT) 8 MG disintegrating tablet Take 1 tablet (8 mg total) by mouth every 8 (eight) hours as needed for nausea or vomiting. 10 tablet 0  . Sulfamethoxazole-Trimethoprim (BACTRIM PO) Take by mouth.    Marland Kitchen HYDROmorphone (DILAUDID) 2 MG tablet Take 1 tablet (2 mg total) by mouth every 4 (four) hours as needed for severe pain (for pain). 10 tablet 0    Results for orders placed or performed during the hospital encounter of 08/22/15 (from the past 48 hour(s))  CBC     Status: None   Collection Time: 08/22/15  6:33 AM  Result Value Ref Range   WBC 9.7 4.0 -  10.5 K/uL   RBC 4.63 3.87 - 5.11 MIL/uL   Hemoglobin 12.4 12.0 - 15.0 g/dL   HCT 16.138.4 09.636.0 - 04.546.0 %   MCV 82.9 78.0 - 100.0 fL   MCH 26.8 26.0 - 34.0 pg   MCHC 32.3 30.0 - 36.0 g/dL   RDW 40.913.5 81.111.5 - 91.415.5 %   Platelets 255 150 - 400 K/uL  hCG, serum, qualitative     Status: None   Collection Time: 08/22/15  6:33 AM  Result Value Ref Range   Preg, Serum NEGATIVE NEGATIVE    Comment:        THE SENSITIVITY OF THIS METHODOLOGY IS >10 mIU/mL.    No results found.  Review of Systems  Constitutional: Negative.   HENT: Negative.   Eyes: Negative.   Respiratory: Negative.   Cardiovascular: Positive for claudication.  Gastrointestinal: Negative.    Musculoskeletal: Negative for myalgias, joint pain and neck pain.  Skin: Negative.   Neurological: Negative.   Endo/Heme/Allergies: Negative.   Psychiatric/Behavioral: Negative.     Blood pressure 120/69, pulse 87, temperature 98 F (36.7 C), temperature source Oral, resp. rate 18, height 4\' 9"  (1.448 m), weight 81.647 kg (180 lb), last menstrual period 07/05/2015, SpO2 97 %. Physical Exam  Constitutional: She is oriented to person, place, and time. She appears well-developed and well-nourished.  HENT:  Head: Normocephalic and atraumatic.  Eyes: Conjunctivae and EOM are normal. Pupils are equal, round, and reactive to light.  Neck: Normal range of motion.  Cardiovascular: Normal rate and regular rhythm.   Musculoskeletal: Normal range of motion.  Neurological: She is alert and oriented to person, place, and time.  Skin: Skin is warm and dry.  Psychiatric: She has a normal mood and affect. Her behavior is normal. Thought content normal.     Assessment/Plan 1) chronic pain 2) lumbar post-laminectomy syndrome 3) back pain, radiculopathy  PLAN: permanent SCS, [employer]  Gwynne EdingerPaul C Numan Zylstra, MD 08/22/2015, 7:20 AM

## 2015-08-22 ENCOUNTER — Ambulatory Visit (HOSPITAL_COMMUNITY): Payer: BLUE CROSS/BLUE SHIELD | Admitting: Anesthesiology

## 2015-08-22 ENCOUNTER — Encounter (HOSPITAL_COMMUNITY)
Admission: RE | Disposition: A | Discharge status: Home / Self Care | Payer: Self-pay | Source: Ambulatory Visit | Attending: Anesthesiology

## 2015-08-22 ENCOUNTER — Encounter (HOSPITAL_COMMUNITY): Payer: Self-pay | Admitting: *Deleted

## 2015-08-22 ENCOUNTER — Ambulatory Visit (HOSPITAL_COMMUNITY): Payer: BLUE CROSS/BLUE SHIELD

## 2015-08-22 ENCOUNTER — Ambulatory Visit (HOSPITAL_COMMUNITY)
Admission: RE | Admit: 2015-08-22 | Discharge: 2015-08-22 | Disposition: A | Discharge status: Home / Self Care | Payer: BLUE CROSS/BLUE SHIELD | Source: Ambulatory Visit | Attending: Anesthesiology | Admitting: Anesthesiology

## 2015-08-22 DIAGNOSIS — Z6838 Body mass index (BMI) 38.0-38.9, adult: Secondary | ICD-10-CM | POA: Diagnosis not present

## 2015-08-22 DIAGNOSIS — M961 Postlaminectomy syndrome, not elsewhere classified: Secondary | ICD-10-CM | POA: Diagnosis present

## 2015-08-22 DIAGNOSIS — F17210 Nicotine dependence, cigarettes, uncomplicated: Secondary | ICD-10-CM | POA: Diagnosis not present

## 2015-08-22 DIAGNOSIS — M5416 Radiculopathy, lumbar region: Secondary | ICD-10-CM | POA: Diagnosis not present

## 2015-08-22 DIAGNOSIS — Z419 Encounter for procedure for purposes other than remedying health state, unspecified: Secondary | ICD-10-CM

## 2015-08-22 HISTORY — DX: Cutaneous abscess, unspecified: L02.91

## 2015-08-22 HISTORY — DX: Anemia, unspecified: D64.9

## 2015-08-22 HISTORY — PX: SPINAL CORD STIMULATOR INSERTION: SHX5378

## 2015-08-22 LAB — CBC
HEMATOCRIT: 38.4 % (ref 36.0–46.0)
HEMOGLOBIN: 12.4 g/dL (ref 12.0–15.0)
MCH: 26.8 pg (ref 26.0–34.0)
MCHC: 32.3 g/dL (ref 30.0–36.0)
MCV: 82.9 fL (ref 78.0–100.0)
Platelets: 255 10*3/uL (ref 150–400)
RBC: 4.63 MIL/uL (ref 3.87–5.11)
RDW: 13.5 % (ref 11.5–15.5)
WBC: 9.7 10*3/uL (ref 4.0–10.5)

## 2015-08-22 LAB — HCG, SERUM, QUALITATIVE: Preg, Serum: NEGATIVE

## 2015-08-22 SURGERY — INSERTION, SPINAL CORD STIMULATOR, LUMBAR
Anesthesia: Monitor Anesthesia Care

## 2015-08-22 MED ORDER — HYDROMORPHONE HCL 1 MG/ML IJ SOLN
INTRAMUSCULAR | Status: AC
  Filled 2015-08-22: qty 1

## 2015-08-22 MED ORDER — PROMETHAZINE HCL 25 MG/ML IJ SOLN: 6.2500 mg | INTRAMUSCULAR | Status: DC | PRN

## 2015-08-22 MED ORDER — PROPOFOL 10 MG/ML IV BOLUS
INTRAVENOUS | Status: AC
  Filled 2015-08-22: qty 20

## 2015-08-22 MED ORDER — MIDAZOLAM HCL 5 MG/5ML IJ SOLN
INTRAMUSCULAR | Status: DC | PRN
  Administered 2015-08-22 (×2): 1 mg via INTRAVENOUS

## 2015-08-22 MED ORDER — LACTATED RINGERS IV SOLN
INTRAVENOUS | Status: DC | PRN
  Administered 2015-08-22: 07:00:00 via INTRAVENOUS

## 2015-08-22 MED ORDER — 0.9 % SODIUM CHLORIDE (POUR BTL) OPTIME
TOPICAL | Status: DC | PRN
  Administered 2015-08-22: 1000 mL

## 2015-08-22 MED ORDER — FENTANYL CITRATE (PF) 250 MCG/5ML IJ SOLN
INTRAMUSCULAR | Status: DC | PRN
  Administered 2015-08-22 (×2): 25 ug via INTRAVENOUS
  Administered 2015-08-22: 50 ug via INTRAVENOUS

## 2015-08-22 MED ORDER — BUPIVACAINE-EPINEPHRINE (PF) 0.5% -1:200000 IJ SOLN
INTRAMUSCULAR | Status: DC | PRN
  Administered 2015-08-22: 40 mL via PERINEURAL

## 2015-08-22 MED ORDER — HYDROMORPHONE HCL 1 MG/ML IJ SOLN
0.2500 mg | INTRAMUSCULAR | Status: DC | PRN
  Administered 2015-08-22 (×3): 0.5 mg via INTRAVENOUS

## 2015-08-22 MED ORDER — PROPOFOL 500 MG/50ML IV EMUL
INTRAVENOUS | Status: DC | PRN
  Administered 2015-08-22: 75 ug/kg/min via INTRAVENOUS

## 2015-08-22 MED ORDER — FENTANYL CITRATE (PF) 250 MCG/5ML IJ SOLN
INTRAMUSCULAR | Status: AC
Start: 2015-08-22 — End: 2015-08-22
  Filled 2015-08-22: qty 5

## 2015-08-22 MED ORDER — PROPOFOL 10 MG/ML IV BOLUS
INTRAVENOUS | Status: DC | PRN
  Administered 2015-08-22 (×2): 20 mg via INTRAVENOUS

## 2015-08-22 MED ORDER — MIDAZOLAM HCL 2 MG/2ML IJ SOLN
INTRAMUSCULAR | Status: AC
  Filled 2015-08-22: qty 2

## 2015-08-22 MED ORDER — SODIUM CHLORIDE 0.9 % IR SOLN
Status: DC | PRN
  Administered 2015-08-22: 08:00:00

## 2015-08-22 MED ORDER — HYDROMORPHONE HCL 4 MG PO TABS: 4.0000 mg | ORAL_TABLET | ORAL | Status: AC | PRN

## 2015-08-22 SURGICAL SUPPLY — 64 items
ANCHOR CLIK X NEURO (Stimulator) ×2 IMPLANT
BAG DECANTER FOR FLEXI CONT (MISCELLANEOUS) ×2 IMPLANT
BENZOIN TINCTURE PRP APPL 2/3 (GAUZE/BANDAGES/DRESSINGS) IMPLANT
BINDER ABDOMINAL 12 ML 46-62 (SOFTGOODS) ×2 IMPLANT
BLADE CLIPPER SURG (BLADE) IMPLANT
CABLE/EXTENSION OR 1X16 61 (CABLE) ×4 IMPLANT
CHLORAPREP W/TINT 26ML (MISCELLANEOUS) ×2 IMPLANT
CLIP TI WIDE RED SMALL 6 (CLIP) IMPLANT
DERMABOND ADVANCED (GAUZE/BANDAGES/DRESSINGS) ×2
DERMABOND ADVANCED .7 DNX12 (GAUZE/BANDAGES/DRESSINGS) ×2 IMPLANT
DRAPE C-ARM 42X72 X-RAY (DRAPES) ×2 IMPLANT
DRAPE C-ARMOR (DRAPES) ×2 IMPLANT
DRAPE LAPAROTOMY 100X72X124 (DRAPES) ×2 IMPLANT
DRAPE POUCH INSTRU U-SHP 10X18 (DRAPES) ×2 IMPLANT
DRAPE SURG 17X23 STRL (DRAPES) ×2 IMPLANT
DRSG OPSITE POSTOP 3X4 (GAUZE/BANDAGES/DRESSINGS) ×2 IMPLANT
DRSG OPSITE POSTOP 4X6 (GAUZE/BANDAGES/DRESSINGS) ×2 IMPLANT
ELECT REM PT RETURN 9FT ADLT (ELECTROSURGICAL) ×2
ELECTRODE REM PT RTRN 9FT ADLT (ELECTROSURGICAL) ×1 IMPLANT
GAUZE SPONGE 4X4 16PLY XRAY LF (GAUZE/BANDAGES/DRESSINGS) ×2 IMPLANT
GLOVE BIOGEL PI IND STRL 7.5 (GLOVE) ×1 IMPLANT
GLOVE BIOGEL PI INDICATOR 7.5 (GLOVE) ×1
GLOVE ECLIPSE 7.5 STRL STRAW (GLOVE) ×2 IMPLANT
GLOVE EXAM NITRILE LRG STRL (GLOVE) IMPLANT
GLOVE EXAM NITRILE MD LF STRL (GLOVE) IMPLANT
GLOVE EXAM NITRILE XL STR (GLOVE) IMPLANT
GLOVE EXAM NITRILE XS STR PU (GLOVE) IMPLANT
GLOVE INDICATOR 7.0 STRL GRN (GLOVE) ×4 IMPLANT
GOWN STRL REUS W/ TWL LRG LVL3 (GOWN DISPOSABLE) IMPLANT
GOWN STRL REUS W/ TWL XL LVL3 (GOWN DISPOSABLE) IMPLANT
GOWN STRL REUS W/TWL 2XL LVL3 (GOWN DISPOSABLE) IMPLANT
GOWN STRL REUS W/TWL LRG LVL3 (GOWN DISPOSABLE)
GOWN STRL REUS W/TWL XL LVL3 (GOWN DISPOSABLE)
IPG PRECISION SPECTRA (Stimulator) ×2 IMPLANT
KIT BASIN OR (CUSTOM PROCEDURE TRAY) ×2 IMPLANT
KIT CHARGING (KITS) ×1
KIT CHARGING PRECISION NEURO (KITS) ×1 IMPLANT
KIT PAT PROGRAM FREELINK (KITS) ×1 IMPLANT
KIT ROOM TURNOVER OR (KITS) ×2 IMPLANT
KIT SPLITTER 30CM 2X8 (Stimulator) ×6 IMPLANT
LEAD KIT CONTACT INFINION 16 (Stimulator) ×4 IMPLANT
LIQUID BAND (GAUZE/BANDAGES/DRESSINGS) ×2 IMPLANT
NEEDLE 18GX1X1/2 (RX/OR ONLY) (NEEDLE) IMPLANT
NEEDLE HYPO 25X1 1.5 SAFETY (NEEDLE) ×2 IMPLANT
NS IRRIG 1000ML POUR BTL (IV SOLUTION) ×2 IMPLANT
PACK LAMINECTOMY NEURO (CUSTOM PROCEDURE TRAY) ×2 IMPLANT
PAD ARMBOARD 7.5X6 YLW CONV (MISCELLANEOUS) ×2 IMPLANT
REMOTE CONTROL KIT (KITS) ×2
SPONGE LAP 4X18 X RAY DECT (DISPOSABLE) ×2 IMPLANT
SPONGE SURGIFOAM ABS GEL SZ50 (HEMOSTASIS) IMPLANT
STAPLER SKIN PROX WIDE 3.9 (STAPLE) ×2 IMPLANT
STRIP CLOSURE SKIN 1/2X4 (GAUZE/BANDAGES/DRESSINGS) IMPLANT
SUT MNCRL AB 4-0 PS2 18 (SUTURE) ×4 IMPLANT
SUT SILK 0 (SUTURE) ×1
SUT SILK 0 MO-6 18XCR BRD 8 (SUTURE) ×1 IMPLANT
SUT SILK 0 TIES 10X30 (SUTURE) IMPLANT
SUT SILK 2 0 TIES 10X30 (SUTURE) IMPLANT
SUT VIC AB 2-0 CP2 18 (SUTURE) ×4 IMPLANT
SYR EPIDURAL 5ML GLASS (SYRINGE) ×2 IMPLANT
SYRINGE 10CC LL (SYRINGE) ×2 IMPLANT
TOWEL OR 17X24 6PK STRL BLUE (TOWEL DISPOSABLE) IMPLANT
TOWEL OR 17X26 10 PK STRL BLUE (TOWEL DISPOSABLE) ×2 IMPLANT
WATER STERILE IRR 1000ML POUR (IV SOLUTION) ×2 IMPLANT
YANKAUER SUCT BULB TIP NO VENT (SUCTIONS) ×2 IMPLANT

## 2015-08-22 NOTE — Transfer of Care (Signed)
Immediate Anesthesia Transfer of Care Note  Patient: Robin MartinezSarah N Walton  Procedure(s) Performed: Procedure(s): LUMBAR SPINAL CORD STIMULATOR INSERTION (N/A)  Patient Location: PACU  Anesthesia Type:MAC  Level of Consciousness: awake, alert , oriented and sedated  Airway & Oxygen Therapy: Patient Spontanous Breathing  Post-op Assessment: Report given to RN, Post -op Vital signs reviewed and stable and Patient moving all extremities X 4  Post vital signs: Reviewed and stable  Last Vitals:  Filed Vitals:   08/22/15 0621  BP: 120/69  Pulse: 87  Temp: 36.7 C  Resp: 18    Complications: No apparent anesthesia complications

## 2015-08-22 NOTE — Op Note (Signed)
PREOP DX: 1) lumbago  2) lumbar radiculopathy  3) lumbar post-laminectomy syndrome  4) chronic pain  POSTOP DX: 1) lumbago  2) lumbar radiculopathy  3) lumbar post-laminectomy syndrome  4) chronic pain PROCEDURES PERFORMED:1) intraop fluoro 2) placement of 2 16 contact [employer] Infinion leads 3) placement of Spectra SCS generator  SURGEON:Laterrica Libman  ASSISTANT: NONE  ANESTHESIA: MAC  EBL: <20cc  DESCRIPTION OF PROCEDURE: After a discussion of risks, benefits and alternatives, informed consent was obtained. The patient was taken to the OR, turned prone onto a Jackson table, all pressure points padded, SCD's placed, and an adequate plane of anesthesia induced. A timeout was taken to verify the correct patient, position, personnel, availability of appropriate equipment, and administration of perioperative antibiotics.  The thoracic and lumbar areas were widely prepped with chloraprep and draped into a sterile field. Fluoroscopy was used to plan a left paramedian incision at the T12-L2/3 levels, and an incision made with a 10 blade and carried down to the dorsolumbar fascia with the bovie and blunt dissection. Retractors were placed and a 14g [employer] tuohy needle placed into the epidural space at the T12-L1 interspace using biplanar fluoro and loss-of-resistance technique. The needle was aspirated without any return of fluid. A [employer] INFINION lead was introduced and under live AP fluoro advanced until the distal-most contact overlay the midportion of the T7 vertebral body shadow with the rest of the contacts distributed over the T7 and T8 vertebral bodies in a position just right of anatomic midline. A second Infinion lead was placed just left of anatomic midline in the same levels using the same technique. The patient was awakened and the leads  tested; impedances were good, and the patient reported good coverage with amplitudes in the 6-9 mA range. 0 silk sutures were placed in the fascia adjacent to the needles. The needles and stylets were removed under fluoroscopy with no lead migration noted. Leads were then fixed to the fascia by chest tube-type fixation into position with the sutures; repeat images were obtained to verify that there had been no lead migration.  The incision was inspected and hemostasis obtained with the bipolar cautery.  Attention was then turned to creation of a subcutaneous pocket. At the left flank, a 3 cm incision was made with a 10 blade and using the bovie and blunt dissection a pocket of size appropriate to place a SCS generator. The pocket was trialed, and found to be of adequate size. The pocket was inspected for hemostasis, which was found to be excellent. Using reverse seldinger technique, the leads were tunneled to the pocket site, and the leads inserted into the SCS generator. Impedances were checked, and all found to be excellent. The leads were then all fixed into position with a self-torquing wrench. The wiring was all carefully coiled, placed behind the generator and placed in the pocket.  Both incisions were copiously irrigated with bacitracin-containing irrigation. The lumbar incision was closed in 2 deep layers of interrupted 2-0 vicryl and the skin closed with staples. The pocket incision was closed with a deeper layer of 2-0 vicryl interrupted sutures, and the skin closed with staples. Sterile dressings were applied. Needle, sponge, and instrument counts were correct x2 at the end of the case.  The patient was then carefully awakened from anesthesia, turned supine, an abdominal binder placed, and the patient taken to the recovery room where she underwent complex spinal cord stimulator programming.  COMPLICATIONS: NONE  CONDITION: Stable throughout the course of the  procedure and immediately afterward   DISPOSITION: discharge to home, with antibiotics and pain medicine. Discussed care with the patient and family member. Followup in clinic will be scheduled in 10-14 days.

## 2015-08-22 NOTE — Discharge Instructions (Signed)
Dr. Lydiann Bonifas Post-Op Orders ° °• Ice Pack - 20 minutes on (in a pillow case), and 20 minutes off. Wear the ice pack UNDER the binder. °• Follow up in office, they will call you for an appointment in 10 days to 2 weeks. °• Increase activity gradually.   °• No lifting anything heavier than a gallon of milk (10 pounds) until seen in the office. °• Advance diet slowly as tolerated. °• Dressing care:  Keep dressing dry for 3 days, and on Post-op day 4, may shower. °• Call for fever, drainage, and redness. °• No swimming or bathing in a bathtub (do not get into standing water). °•  °

## 2015-08-22 NOTE — Anesthesia Postprocedure Evaluation (Signed)
Anesthesia Post Note  Patient: Robin MartinezSarah N Walton  Procedure(s) Performed: Procedure(s) (LRB): LUMBAR SPINAL CORD STIMULATOR INSERTION (N/A)  Patient location during evaluation: PACU Anesthesia Type: MAC Level of consciousness: awake and alert Pain management: pain level controlled Vital Signs Assessment: post-procedure vital signs reviewed and stable Respiratory status: spontaneous breathing and respiratory function stable Cardiovascular status: stable Anesthetic complications: no    Last Vitals:  Filed Vitals:   08/22/15 0954 08/22/15 1000  BP:  130/73  Pulse:  102  Temp: 36.1 C   Resp:  15    Last Pain:  Filed Vitals:   08/22/15 1012  PainSc: Asleep                 Valynn Schamberger DANIEL

## 2015-08-25 ENCOUNTER — Encounter (HOSPITAL_COMMUNITY): Payer: Self-pay | Admitting: Anesthesiology

## 2015-11-28 ENCOUNTER — Encounter: Payer: Self-pay | Admitting: Emergency Medicine

## 2015-11-28 ENCOUNTER — Emergency Department
Admission: EM | Admit: 2015-11-28 | Discharge: 2015-11-28 | Disposition: A | Discharge status: Home / Self Care | Payer: BLUE CROSS/BLUE SHIELD | Attending: Emergency Medicine | Admitting: Emergency Medicine

## 2015-11-28 DIAGNOSIS — F329 Major depressive disorder, single episode, unspecified: Secondary | ICD-10-CM | POA: Insufficient documentation

## 2015-11-28 DIAGNOSIS — F17210 Nicotine dependence, cigarettes, uncomplicated: Secondary | ICD-10-CM | POA: Diagnosis not present

## 2015-11-28 DIAGNOSIS — N938 Other specified abnormal uterine and vaginal bleeding: Secondary | ICD-10-CM | POA: Insufficient documentation

## 2015-11-28 DIAGNOSIS — N939 Abnormal uterine and vaginal bleeding, unspecified: Secondary | ICD-10-CM | POA: Diagnosis present

## 2015-11-28 LAB — CBC
HEMATOCRIT: 40.7 % (ref 35.0–47.0)
HEMOGLOBIN: 13.5 g/dL (ref 12.0–16.0)
MCH: 27.6 pg (ref 26.0–34.0)
MCHC: 33.2 g/dL (ref 32.0–36.0)
MCV: 83.2 fL (ref 80.0–100.0)
Platelets: 332 10*3/uL (ref 150–440)
RBC: 4.9 MIL/uL (ref 3.80–5.20)
RDW: 14.1 % (ref 11.5–14.5)
WBC: 11.8 10*3/uL — ABNORMAL HIGH (ref 3.6–11.0)

## 2015-11-28 LAB — BASIC METABOLIC PANEL
Anion gap: 9 (ref 5–15)
BUN: 6 mg/dL (ref 6–20)
CALCIUM: 8.7 mg/dL — AB (ref 8.9–10.3)
CHLORIDE: 105 mmol/L (ref 101–111)
CO2: 23 mmol/L (ref 22–32)
CREATININE: 0.85 mg/dL (ref 0.44–1.00)
GFR calc Af Amer: >60 mL/min (ref >60)
GFR calc non Af Amer: >60 mL/min (ref >60)
Glucose, Bld: 92 mg/dL (ref 65–99)
Potassium: 3.4 mmol/L — ABNORMAL LOW (ref 3.5–5.1)
Sodium: 137 mmol/L (ref 135–145)

## 2015-11-28 LAB — POCT PREGNANCY, URINE: Preg Test, Ur: NEGATIVE

## 2015-11-28 MED ORDER — NORGESTIMATE-ETH ESTRADIOL 0.25-35 MG-MCG PO TABS: 1.0000 | ORAL_TABLET | Freq: Every day | ORAL | Status: DC

## 2015-11-28 MED ORDER — OXYCODONE-ACETAMINOPHEN 5-325 MG PO TABS: 1.0000 | ORAL_TABLET | Freq: Four times a day (QID) | ORAL | Status: DC | PRN

## 2015-11-28 NOTE — ED Notes (Signed)
Pt alert and oriented X4, active, cooperative, pt in NAD. RR even and unlabored, color WNL.  Pt informed to return if any life threatening symptoms occur.   

## 2015-11-28 NOTE — Discharge Instructions (Signed)

## 2015-11-28 NOTE — ED Notes (Signed)
MD at bedside. 

## 2015-11-28 NOTE — ED Notes (Addendum)
Pt states she had a gush of discharge on Wednesday that was clear and had pink tinge to it.  Patient has hx of PCOS so has abnormal periods.  Pt states vaginal bleeding has increased today to where she is using an ultra tampon and pad every hour. Pt also reports cramping.

## 2015-11-28 NOTE — ED Notes (Signed)
PT reports that she felt gush of clear and pink tinged fluid come out X 1 day ago, pelvic cramping and heavy vaginal bleeding today. 3 ultra pads per hour per her report. Pt alert and oriented X4, active, cooperative, pt in NAD. RR even and unlabored, color WNL.  Hx of PCOS. Irregular periods, last 11/01/15

## 2015-11-28 NOTE — ED Provider Notes (Signed)
[facility] Emergency Department Provider Note  Time seen: 4:03 PM  I have reviewed the triage vital signs and the nursing notes.   HISTORY  Chief Complaint Vaginal Bleeding and Vaginal Discharge    HPI Robin MartinezSarah N Walton is a 30 y.o. female with a past medical history of PE COS, depression, anxiety, anemia, back pain with a spinal stimulator, presents to the emergency department for abdominal cramping and heavy bleeding. According to the patient for the past 3 days she has had heavy vaginal bleeding. She states it has increased today where she is going through approximately one pad per hour. Denies any lightheadedness or dizziness. Patient describes abdominal cramping. States she typically goes 6 months to one year between periods, but she last had a period approximately 2 months ago. Patient denies any fever, nausea, vomiting, diarrhea. Describes abdominal cramping as moderate consistent with previous periods.     Past Medical History  Diagnosis Date  . PCOS (polycystic ovarian syndrome)   . Depression   . Anxiety   . Kidney infection 7/15  . Lung collapse 7/15    both lungs collapse, no symptoms,,spontanous([facility])  . Anemia     as a child  . Abscess     right breast    Patient Active Problem List   Diagnosis Date Noted  . Wound drainage 05/10/2014    Past Surgical History  Procedure Laterality Date  . Back surgery    . Cholecystectomy    . Breast surgery Right 9/16    lumpectomy  . Spinal cord stimulator insertion N/A 08/22/2015    Procedure: LUMBAR SPINAL CORD STIMULATOR INSERTION;  Surgeon: Odette FractionPaul Harkins, MD;  Location: [facility] NEUR[facility] ORS;  Service: Neurosurgery;  Laterality: N/A;    Current Outpatient Rx  Name  Route  Sig  Dispense  Refill  . clindamycin (CLEOCIN) 300 MG capsule   Oral   Take 1 capsule (300 mg total) by mouth 4 (four) times daily. X 7 days   28 capsule   0   . HYDROmorphone (DILAUDID) 2 MG tablet   Oral   Take 1 tablet (2 mg total) by mouth every 4 (four) hours as needed for severe pain (for pain).   10 tablet   0   . HYDROmorphone (DILAUDID) 4 MG tablet   Oral   Take 1 tablet (4 mg total) by mouth every 4 (four) hours as needed for moderate pain or severe pain.   50 tablet   0   . ondansetron (ZOFRAN ODT) 8 MG disintegrating tablet   Oral   Take 1 tablet (8 mg total) by mouth every 8 (eight) hours as needed for nausea or vomiting.   10 tablet   0   . Sulfamethoxazole-Trimethoprim (BACTRIM PO)   Oral   Take by mouth.           Allergies Penicillins; Morphine and related; Oxycodone; Hydrocodone; Silver; and Tape  Family History  Problem Relation Age of Onset  . Diabetes Mother   . Heart disease Mother   . COPD Mother   . Cancer Brother   . Bipolar disorder Brother   . Schizophrenia Brother   . Alzheimer's disease Other   . Dementia Other     Social History Social History  Substance Use Topics  . Smoking status: Light Tobacco Smoker -- 0.25 packs/day for 13 years    Types: Cigarettes  . Smokeless tobacco: Never Used  . Alcohol Use: No    Review of Systems Constitutional:  Negative for fever. Cardiovascular: Negative for chest pain. Respiratory: Negative for shortness of breath. Gastrointestinal: Lower abdominal pain/cramping. Genitourinary: Negative for dysuria. Positive for vaginal bleeding. Neurological: Negative for headache 10-point ROS otherwise negative.  ____________________________________________   PHYSICAL EXAM:  VITAL SIGNS: ED Triage Vitals  Enc Vitals Group     BP 11/28/15 1426 110/75 mmHg     Pulse Rate 11/28/15 1426 89     Resp 11/28/15 1426 20     Temp 11/28/15 1426 98.1 F (36.7 C)     Temp Source 11/28/15 1426 Oral     SpO2 11/28/15 1426 100 %     Weight 11/28/15 1426 180 lb (81.647 kg)     Height 11/28/15 1426  (1.448 m)     Head Cir --      Peak Flow --      Pain Score 11/28/15 1427 4     Pain Loc --      Pain Edu? --       Excl. in GC? --     Constitutional: Alert and oriented. Well appearing and in no distress. Eyes: Normal exam ENT   Head: Normocephalic and atraumatic.   Mouth/Throat: Mucous membranes are moist. Cardiovascular: Normal rate, regular rhythm. No murmur Respiratory: Normal respiratory effort without tachypnea nor retractions. Breath sounds are clear  Gastrointestinal: Soft and nontender. No distention.   Genitourinary: Mild to moderate vaginal bleeding during pelvic examination, no cervical clot. Largely nontender. Musculoskeletal: Nontender with normal range of motion in all extremities Neurologic:  Normal speech and language. No gross focal neurologic deficits Skin:  Skin is warm, dry and intact.  Psychiatric: Mood and affect are normal.   ____________________________________________    INITIAL IMPRESSION / ASSESSMENT AND PLAN / ED COURSE  Pertinent labs & imaging results that were available during my care of the patient were reviewed by me and considered in my medical decision making (see chart for details).  Patient presents for heavy vaginal bleeding. Patient states a heavy bleeding started 3 days ago. She states she initially noted a gush of pink fluid in and then the bleeding began. Patient's labs are largely within normal limits, pregnancy test is negative. Mild to moderate bleeding during pelvic examination. We will start patient on OCP tablets, have her follow up with OB/GYN. Patient is agreeable to this plan. I discussed very strict return precautions for any worsening bleeding, dizziness or lightheadedness. We'll also place the patient on a short course of pain medication if needed for her abdominal cramping.  ____________________________________________   FINAL CLINICAL IMPRESSION(S) / ED DIAGNOSES  Dysfunctional uterine bleeding   [name], MD 11/28/15 1607

## 2016-01-15 ENCOUNTER — Emergency Department: Payer: BLUE CROSS/BLUE SHIELD

## 2016-01-15 ENCOUNTER — Emergency Department
Admission: EM | Admit: 2016-01-15 | Discharge: 2016-01-15 | Disposition: A | Discharge status: Home / Self Care | Payer: BLUE CROSS/BLUE SHIELD | Attending: Emergency Medicine | Admitting: Emergency Medicine

## 2016-01-15 ENCOUNTER — Encounter: Payer: Self-pay | Admitting: Urgent Care

## 2016-01-15 DIAGNOSIS — F17210 Nicotine dependence, cigarettes, uncomplicated: Secondary | ICD-10-CM | POA: Diagnosis not present

## 2016-01-15 DIAGNOSIS — R1011 Right upper quadrant pain: Secondary | ICD-10-CM | POA: Diagnosis not present

## 2016-01-15 DIAGNOSIS — M549 Dorsalgia, unspecified: Secondary | ICD-10-CM | POA: Insufficient documentation

## 2016-01-15 DIAGNOSIS — R109 Unspecified abdominal pain: Secondary | ICD-10-CM

## 2016-01-15 LAB — COMPREHENSIVE METABOLIC PANEL
ALBUMIN: 4.1 g/dL (ref 3.5–5.0)
ALK PHOS: 74 U/L (ref 38–126)
ALT: 28 U/L (ref 14–54)
AST: 26 U/L (ref 15–41)
Anion gap: 6 (ref 5–15)
BILIRUBIN TOTAL: 0.3 mg/dL (ref 0.3–1.2)
BUN: 7 mg/dL (ref 6–20)
CALCIUM: 8.9 mg/dL (ref 8.9–10.3)
CO2: 25 mmol/L (ref 22–32)
CREATININE: 0.79 mg/dL (ref 0.44–1.00)
Chloride: 107 mmol/L (ref 101–111)
GFR calc Af Amer: >60 mL/min (ref >60)
GLUCOSE: 95 mg/dL (ref 65–99)
POTASSIUM: 3.7 mmol/L (ref 3.5–5.1)
Sodium: 138 mmol/L (ref 135–145)
TOTAL PROTEIN: 8 g/dL (ref 6.5–8.1)

## 2016-01-15 LAB — URINALYSIS COMPLETE WITH MICROSCOPIC (ARMC ONLY)
BILIRUBIN URINE: NEGATIVE
GLUCOSE, UA: NEGATIVE mg/dL
HGB URINE DIPSTICK: NEGATIVE
KETONES UR: NEGATIVE mg/dL
LEUKOCYTES UA: NEGATIVE
NITRITE: NEGATIVE
Protein, ur: NEGATIVE mg/dL
Specific Gravity, Urine: 1.01 (ref 1.005–1.030)
pH: 6 (ref 5.0–8.0)

## 2016-01-15 LAB — CBC
HCT: 42.4 % (ref 35.0–47.0)
Hemoglobin: 14 g/dL (ref 12.0–16.0)
MCH: 27.5 pg (ref 26.0–34.0)
MCHC: 33.1 g/dL (ref 32.0–36.0)
MCV: 83 fL (ref 80.0–100.0)
PLATELETS: 296 10*3/uL (ref 150–440)
RBC: 5.11 MIL/uL (ref 3.80–5.20)
RDW: 13.8 % (ref 11.5–14.5)
WBC: 9.9 10*3/uL (ref 3.6–11.0)

## 2016-01-15 LAB — LIPASE, BLOOD: Lipase: 21 U/L (ref 11–51)

## 2016-01-15 LAB — POCT PREGNANCY, URINE: PREG TEST UR: NEGATIVE

## 2016-01-15 MED ORDER — CEPHALEXIN 500 MG PO CAPS
500.0000 mg | ORAL_CAPSULE | Freq: Once | ORAL | Status: AC
  Administered 2016-01-15: 500 mg via ORAL

## 2016-01-15 MED ORDER — OXYCODONE-ACETAMINOPHEN 5-325 MG PO TABS
2.0000 | ORAL_TABLET | Freq: Once | ORAL | Status: AC
  Administered 2016-01-15: 2 via ORAL
  Filled 2016-01-15: qty 2

## 2016-01-15 MED ORDER — DIPHENHYDRAMINE HCL 25 MG PO CAPS
25.0000 mg | ORAL_CAPSULE | Freq: Once | ORAL | Status: AC
  Administered 2016-01-15: 25 mg via ORAL
  Filled 2016-01-15: qty 1

## 2016-01-15 MED ORDER — CEPHALEXIN 500 MG PO CAPS
ORAL_CAPSULE | ORAL | Status: AC
  Filled 2016-01-15: qty 1

## 2016-01-15 MED ORDER — CEPHALEXIN 250 MG PO CAPS: 250.0000 mg | ORAL_CAPSULE | Freq: Four times a day (QID) | ORAL | 0 refills | Status: AC

## 2016-01-15 NOTE — ED Triage Notes (Signed)
Patient presents to the ED with c/o upper abdominal pain with (+) nausea and urinary frequency x 1 week. Patient denies dysuria and fever. Of note, patient reports similar symptoms last year; UA was reported to be negative and patient ended up being admitted with a "double kidney infection and partially collapsed lung that was only visible on MRI". Patient reports that she is feeling similar today.

## 2016-01-15 NOTE — ED Provider Notes (Signed)
[facility] Emergency Department Provider Note        Time seen: ----------------------------------------- 9:53 PM on 01/15/2016 -----------------------------------------    I have reviewed the triage vital signs and the nursing notes.   HISTORY  Chief Complaint Abdominal Pain; Urinary Frequency; and Nausea    HPI [name] is a 30 y.o. female who presents to ER for right upper quadrant pain that radiates into her back. Patient has pierced and a cholecystectomy. She was told the past when she had pain like this that she had both kidneys infected and a pneumothorax. She denies any urinary symptoms, denies fevers, chills or other complaints.   Past Medical History:  Diagnosis Date  . Abscess    right breast  . Anemia    as a child  . Anxiety   . Depression   . Kidney infection 7/15  . Lung collapse 7/15   both lungs collapse, no symptoms,,spontanous([facility])  . PCOS (polycystic ovarian syndrome)     Patient Active Problem List   Diagnosis Date Noted  . Wound drainage 05/10/2014    Past Surgical History:  Procedure Laterality Date  . BACK SURGERY    . BREAST SURGERY Right 9/16   lumpectomy  . CHOLECYSTECTOMY    . SPINAL CORD STIMULATOR INSERTION N/A 08/22/2015   Procedure: LUMBAR SPINAL CORD STIMULATOR INSERTION;  Surgeon: Odette Fraction, MD;  Location: [facility]UR[facility];  Service: Neurosurgery;  Laterality: N/A;    Allergies Penicillins; Morphine and related; Oxycodone; Hydrocodone; Silver; and Tape  Social History Social History  Substance Use Topics  . Smoking status: Light Tobacco Smoker    Packs/day: 0.25    Years: 13.00    Types: Cigarettes  . Smokeless tobacco: Never Used  . Alcohol use No    Review of Systems Constitutional: Negative for fever. Cardiovascular: Negative for chest pain. Respiratory: Negative for shortness of breath. Gastrointestinal: Positive for abdominal pain Genitourinary: Negative  for dysuria. Musculoskeletal: Positive for back pain Skin: Negative for rash. Neurological: Negative for headaches, focal weakness or numbness.  10-point ROS otherwise negative.  ____________________________________________   PHYSICAL EXAM:  VITAL SIGNS: ED Triage Vitals  Enc Vitals Group     BP 01/15/16 2047 109/81     Pulse Rate 01/15/16 2047 (!) 112     Resp 01/15/16 2047 20     Temp 01/15/16 2047 98.7 F (37.1 C)     Temp Source 01/15/16 2047 Oral     SpO2 01/15/16 2047 99 %     Weight 01/15/16 2047 180 lb (81.6 kg)     Height 01/15/16 2047 4\' 9"  (1.448 m)     Head Circumference --      Peak Flow --      Pain Score 01/15/16 2048 7     Pain Loc --      Pain Edu? --      Excl. in GC? --     Constitutional: Alert and oriented. Well appearing and in no distress. Eyes: Conjunctivae are normal. PERRL. Normal extraocular movements. ENT   Head: Normocephalic and atraumatic.   Nose: No congestion/rhinnorhea.   Mouth/Throat: Mucous membranes are moist.   Neck: No stridor. Cardiovascular: Normal rate, regular rhythm. No murmurs, rubs, or gallops. Respiratory: Normal respiratory effort without tachypnea nor retractions. Breath sounds are clear and equal bilaterally. No wheezes/rales/rhonchi. Gastrointestinal: Soft and nontender. Normal bowel sounds Musculoskeletal: Nontender with normal range of motion in all extremities. No lower extremity tenderness nor edema. Neurologic:  Normal speech  and language. No gross focal neurologic deficits are appreciated.  Skin:  Skin is warm, dry and intact. No rash noted. Psychiatric: Mood and affect are normal. Speech and behavior are normal.  ____________________________________________  ED COURSE:  Pertinent labs & imaging results that were available during my care of the patient were reviewed by me and considered in my medical decision making (see chart for details). Clinical Course  Patient is no acute distress, we will  check basic labs and consider imaging.  Procedures ____________________________________________   LABS (pertinent positives/negatives)  Labs Reviewed  URINALYSIS COMPLETEWITH MICROSCOPIC ([facility] ONLY) - Abnormal; Notable for the following:       Result Value   Color, Urine YELLOW (*)    APPearance CLEAR (*)    Bacteria, UA RARE (*)    Squamous Epithelial / LPF 0-5 (*)    All other components within normal limits  LIPASE, BLOOD  COMPREHENSIVE METABOLIC PANEL  CBC  POC URINE PREG, ED  POCT PREGNANCY, URINE    RADIOLOGY Images were viewed by me  CT renal protocol IMPRESSION: 1. No CT evidence for nephrolithiasis or obstructive uropathy. 2. Mild circumferential bladder wall thickening, favored to be related to incomplete distension, although possible acute cystitis could also have this appearance. Correlation with urinalysis recommended. 3. No other acute intra-abdominal or pelvic process. 4. Status post cholecystectomy. 5. Spinal stimulator device in place as above.   ____________________________________________  FINAL ASSESSMENT AND PLAN  Flank pain  Plan: Patient with labs and imaging as dictated above. No clear etiology for her symptoms. We will send urine culture, I'll place her on Keflex to cover her for UTI. She states in the past she was misdiagnosed and she eventually had a bladder infection that led to kidney infections. She is stable for outpatient follow-up at this time.   Emily Filbert, MD   Note: This dictation was prepared with Dragon dictation. Any transcriptional errors that result from this process are unintentional    Emily Filbert, MD 01/15/16 2245

## 2016-01-17 LAB — URINE CULTURE

## 2016-07-28 ENCOUNTER — Other Ambulatory Visit: Payer: Self-pay | Admitting: Anesthesiology

## 2016-07-28 DIAGNOSIS — M5416 Radiculopathy, lumbar region: Secondary | ICD-10-CM

## 2016-08-04 ENCOUNTER — Ambulatory Visit
Admission: RE | Admit: 2016-08-04 | Discharge: 2016-08-04 | Disposition: A | Discharge status: Home / Self Care | Payer: BLUE CROSS/BLUE SHIELD | Source: Ambulatory Visit | Attending: Anesthesiology | Admitting: Anesthesiology

## 2016-08-04 DIAGNOSIS — M5416 Radiculopathy, lumbar region: Secondary | ICD-10-CM

## 2016-08-04 MED ORDER — IOPAMIDOL (ISOVUE-M 200) INJECTION 41%
15.0000 mL | Freq: Once | INTRAMUSCULAR | Status: AC
  Administered 2016-08-04: 15 mL via INTRATHECAL

## 2016-08-04 MED ORDER — HYDROXYZINE HCL 50 MG/ML IM SOLN
25.0000 mg | Freq: Once | INTRAMUSCULAR | Status: AC
  Administered 2016-08-04: 25 mg via INTRAMUSCULAR

## 2016-08-04 MED ORDER — MEPERIDINE HCL 100 MG/ML IJ SOLN
75.0000 mg | Freq: Once | INTRAMUSCULAR | Status: AC
  Administered 2016-08-04: 75 mg via INTRAMUSCULAR

## 2016-08-04 MED ORDER — HYDROMORPHONE HCL 1 MG/ML IJ SOLN
1.0000 mg | Freq: Once | INTRAMUSCULAR | Status: AC
  Administered 2016-08-04: 2 mg via INTRAMUSCULAR

## 2016-08-04 MED ORDER — DIAZEPAM 5 MG PO TABS
10.0000 mg | ORAL_TABLET | Freq: Once | ORAL | Status: AC
  Administered 2016-08-04: 10 mg via ORAL

## 2016-08-04 NOTE — Discharge Instructions (Signed)

## 2016-09-14 ENCOUNTER — Emergency Department
Admission: EM | Admit: 2016-09-14 | Discharge: 2016-09-15 | Disposition: A | Discharge status: Home / Self Care | Payer: BLUE CROSS/BLUE SHIELD | Attending: Emergency Medicine | Admitting: Emergency Medicine

## 2016-09-14 ENCOUNTER — Encounter: Payer: Self-pay | Admitting: Emergency Medicine

## 2016-09-14 DIAGNOSIS — F17210 Nicotine dependence, cigarettes, uncomplicated: Secondary | ICD-10-CM | POA: Diagnosis not present

## 2016-09-14 DIAGNOSIS — N390 Urinary tract infection, site not specified: Secondary | ICD-10-CM

## 2016-09-14 DIAGNOSIS — R1084 Generalized abdominal pain: Secondary | ICD-10-CM | POA: Diagnosis present

## 2016-09-14 LAB — URINALYSIS, COMPLETE (UACMP) WITH MICROSCOPIC
BILIRUBIN URINE: NEGATIVE
Bacteria, UA: NONE SEEN
GLUCOSE, UA: NEGATIVE mg/dL
Ketones, ur: NEGATIVE mg/dL
LEUKOCYTES UA: NEGATIVE
NITRITE: NEGATIVE
PH: 6 (ref 5.0–8.0)
Protein, ur: NEGATIVE mg/dL
SPECIFIC GRAVITY, URINE: 1.019 (ref 1.005–1.030)

## 2016-09-14 LAB — COMPREHENSIVE METABOLIC PANEL
ALBUMIN: 3.9 g/dL (ref 3.5–5.0)
ALK PHOS: 62 U/L (ref 38–126)
ALT: 32 U/L (ref 14–54)
ANION GAP: 6 (ref 5–15)
AST: 23 U/L (ref 15–41)
BILIRUBIN TOTAL: 0.4 mg/dL (ref 0.3–1.2)
BUN: 6 mg/dL (ref 6–20)
CALCIUM: 8.5 mg/dL — AB (ref 8.9–10.3)
CO2: 28 mmol/L (ref 22–32)
Chloride: 99 mmol/L — ABNORMAL LOW (ref 101–111)
Creatinine, Ser: 0.83 mg/dL (ref 0.44–1.00)
GFR calc Af Amer: >60 mL/min (ref >60)
GLUCOSE: 90 mg/dL (ref 65–99)
POTASSIUM: 3.3 mmol/L — AB (ref 3.5–5.1)
Sodium: 133 mmol/L — ABNORMAL LOW (ref 135–145)
TOTAL PROTEIN: 7.6 g/dL (ref 6.5–8.1)

## 2016-09-14 LAB — CBC
HEMATOCRIT: 39.5 % (ref 35.0–47.0)
Hemoglobin: 13.5 g/dL (ref 12.0–16.0)
MCH: 28.1 pg (ref 26.0–34.0)
MCHC: 34.1 g/dL (ref 32.0–36.0)
MCV: 82.4 fL (ref 80.0–100.0)
PLATELETS: 273 10*3/uL (ref 150–440)
RBC: 4.8 MIL/uL (ref 3.80–5.20)
RDW: 14 % (ref 11.5–14.5)
WBC: 9.8 10*3/uL (ref 3.6–11.0)

## 2016-09-14 LAB — LIPASE, BLOOD: Lipase: 20 U/L (ref 11–51)

## 2016-09-14 LAB — POCT PREGNANCY, URINE: PREG TEST UR: NEGATIVE

## 2016-09-14 NOTE — ED Provider Notes (Signed)
[facility] Emergency Department Provider Note  ____________________________________________   None    (approximate)  I have reviewed the triage vital signs and the nursing notes.   HISTORY  Chief Complaint Abdominal Pain    HPI [name] is a 31 y.o. female reports a history of multiple prior kidney infections with normal urinalyses, PCOS, irregular menstrual cycles, a prior pneumothorax a couple of years ago, and chronic back problems with a pain contract and multiple prior back surgeries.  She presents per private vehicle for several days of gradual left-sided flank pain and left lower abdominal pain.  She reports that the pain is severe and the Dilaudid she takes at home does not help.  She has had some nausea but no vomiting.  She denies diarrhea and constipation.  She denies fever/chills, chest pain, shortness of breath.  She states that movement makes the symptoms worse and nothing makes it better.  She has had some foul-smelling urine and increased urinary frequency recently that is also similar to prior.    States that this has happened to her in the past and she was not treated and then became ill due to a urinary tract infection that went to her kidneys.  She was seen last year for similar symptoms by Dr. Mayford Knife and she had a CT scan which showed some bladder wall thickening so she was treated with Keflex and her symptoms resolved.   Past Medical History:  Diagnosis Date  . Abscess    right breast  . Anemia    as a child  . Anxiety   . Depression   . Kidney infection 7/15  . Lung collapse 7/15   both lungs collapse, no symptoms,,spontanous([facility])  . PCOS (polycystic ovarian syndrome)     Patient Active Problem List   Diagnosis Date Noted  . Wound drainage 05/10/2014    Past Surgical History:  Procedure Laterality Date  . BACK SURGERY    . BREAST SURGERY Right 9/16   lumpectomy  . CHOLECYSTECTOMY    .  SPINAL CORD STIMULATOR INSERTION N/A 08/22/2015   Procedure: LUMBAR SPINAL CORD STIMULATOR INSERTION;  Surgeon: Odette Fraction, MD;  Location: [facility];  Service: Neurosurgery;  Laterality: N/A;    Prior to Admission medications   Medication Sig Start Date End Date Taking? Authorizing Provider  HYDROmorphone (DILAUDID) 4 MG tablet Take 1 tablet (4 mg total) by mouth every 4 (four) hours as needed for moderate pain or severe pain. 08/22/15   Odette Fraction, MD    Allergies Penicillins; Morphine and related; Oxycodone; Hydrocodone; Silver; and Tape  Family History  Problem Relation Age of Onset  . Diabetes Mother   . Heart disease Mother   . COPD Mother   . Cancer Brother   . Bipolar disorder Brother   . Schizophrenia Brother   . Alzheimer's disease Other   . Dementia Other     Social History Social History  Substance Use Topics  . Smoking status: Light Tobacco Smoker    Packs/day: 0.25    Years: 13.00    Types: Cigarettes  . Smokeless tobacco: Never Used  . Alcohol use No    Review of Systems Constitutional: No fever/chills Eyes: No visual changes. ENT: No sore throat. Cardiovascular: Denies chest pain. Respiratory: Denies shortness of breath. Gastrointestinal: Left flank and left sided abdominal pain.  Nausea, no vomiting.  No diarrhea.  No constipation. Genitourinary: Negative for dysuria. Musculoskeletal: Negative for back pain. Skin: Negative for  rash. Neurological: Negative for headaches, focal weakness or numbness.  10-point ROS otherwise negative.  ____________________________________________   PHYSICAL EXAM:  VITAL SIGNS: ED Triage Vitals [09/14/16 1907]  Enc Vitals Group     BP 121/70     Pulse Rate 91     Resp 18     Temp 97.7 F (36.5 C)     Temp Source Oral     SpO2 97 %     Weight 180 lb (81.6 kg)     Height  (1.448 m)     Head Circumference      Peak Flow      Pain Score 7     Pain Loc      Pain Edu?      Excl. in GC?      Constitutional: Alert and oriented. Well appearing and in no acute distress. Eyes: Conjunctivae are normal. PERRL. EOMI. Head: Atraumatic. Nose: No congestion/rhinnorhea. Mouth/Throat: Mucous membranes are moist. Neck: No stridor.  No meningeal signs.   Cardiovascular: Normal rate, regular rhythm. Good peripheral circulation. Grossly normal heart sounds. Respiratory: Normal respiratory effort.  No retractions. Lungs CTAB. Gastrointestinal: Soft with diffuse tenderness throughout abdomen with no specific focal tenderness Musculoskeletal: No lower extremity tenderness nor edema. No gross deformities of extremities. Neurologic:  Normal speech and language. No gross focal neurologic deficits are appreciated.  Skin:  Skin is warm, dry and intact. No rash noted. Psychiatric: Mood and affect are normal. Speech and behavior are normal.  ____________________________________________   LABS (all labs ordered are listed, but only abnormal results are displayed)  Labs Reviewed  COMPREHENSIVE METABOLIC PANEL - Abnormal; Notable for the following:       Result Value   Sodium 133 (*)    Potassium 3.3 (*)    Chloride 99 (*)    Calcium 8.5 (*)    All other components within normal limits  URINALYSIS, COMPLETE (UACMP) WITH MICROSCOPIC - Abnormal; Notable for the following:    Color, Urine YELLOW (*)    APPearance CLEAR (*)    Hgb urine dipstick SMALL (*)    Squamous Epithelial / LPF 0-5 (*)    All other components within normal limits  URINE CULTURE  LIPASE, BLOOD  CBC  POC URINE PREG, ED  POCT PREGNANCY, URINE   ____________________________________________  EKG  None - EKG not ordered by ED physician ____________________________________________  RADIOLOGY   No results found.  ____________________________________________   PROCEDURES  Critical Care performed: No   Procedure(s) performed:   Procedures   ____________________________________________   INITIAL  IMPRESSION / ASSESSMENT AND PLAN / ED COURSE  Pertinent labs & imaging results that were available during my care of the patient were reviewed by me and considered in my medical decision making (see chart for details).     Clinical Course as of Sep 15 25  Tue Sep 14, 2016  2349 I reviewed the patient's prescription history over the last 12 months in the multi-state controlled substances database(s) that includes [place], [place], [place], [place], [place], [place], [place], [place], [place], [place], [place], [place], [place], and [place].  Results were notable for essentially monthly prescriptions for Dilaudid which have increased from 2 mg tablets to 4 mg tablets over the course of the last year.  They are provided by 2 different providers, Drs. Ollen Bowl and [place].  The most recent prescription was filled yesterday (less than 24 hours ago).   [CF]  Wed Sep 15, 2016  0022 I had a  long conversation with the patient.  I ensure that her labs are all reassuring including her urinalysis but I understand this is better problem for her in the past.  Because of her chronic back problems and PCO S she receives a lot of imaging and I explained that I do not want to get unnecessary scans and expose her to unnecessary radiation.  She has no specific focal tenderness and I do not think she would benefit from a CT scan at this time.  She understands and agrees.  We talked about other possible etiologies of the pain including ovarian cyst, why I think it is very unlikely she has torsion, appendicitis, cholecystitis (she is s/p cholecystectomy), etc.  At this time I think she most likely suffering from either pain associated with her PCOS or another issue with her bladder.  I will treat her empirically with Keflex because this seemed to work for her in the past.  I reviewed prior CT scans and although the last one was a renal stone protocol without contrast, she had a scan in  2015 with contrast and there is no mention of any diverticular disease.  I think that diverticulitis and a 31 year old with no other signs or symptoms would be very unexpected.  I had my usual and customary abdominal pain discussion including return precautions and I also encouraged her to establish a primary care doctor besides the pain management providers.  She understands and agrees with plan.  [CF]    Clinical Course User Index [CF] Loleta Rose, MD    ____________________________________________  FINAL CLINICAL IMPRESSION(S) / ED DIAGNOSES  Final diagnoses:  Generalized abdominal pain  Lower urinary tract infectious disease     MEDICATIONS GIVEN DURING THIS VISIT:  Medications  cephALEXin (KEFLEX) capsule 500 mg (500 mg Oral Given 09/15/16 0021)     NEW OUTPATIENT MEDICATIONS STARTED DURING THIS VISIT:  New Prescriptions   No medications on file    Modified Medications   No medications on file    Discontinued Medications   No medications on file     Note:  This document was prepared using Dragon voice recognition software and may include unintentional dictation errors.    Loleta Rose, MD 09/15/16 [phone]

## 2016-09-14 NOTE — ED Notes (Signed)
Pt states that she has PCOS with history of ovarian cysts.

## 2016-09-14 NOTE — ED Triage Notes (Addendum)
Patient ambulatory to triage with steady gait, without difficulty or distress noted; pt reports left lower abd pain x 2 days accomp by nausea; st hx of same and dx with kidney infection; pt is chronic pain pt for back pain

## 2016-09-15 MED ORDER — CEPHALEXIN 500 MG PO CAPS
500.0000 mg | ORAL_CAPSULE | Freq: Once | ORAL | Status: AC
  Administered 2016-09-15: 500 mg via ORAL
  Filled 2016-09-15: qty 1

## 2016-09-15 MED ORDER — CEPHALEXIN 500 MG PO CAPS: 500.0000 mg | ORAL_CAPSULE | Freq: Two times a day (BID) | ORAL | 0 refills | Status: AC

## 2016-09-15 NOTE — Discharge Instructions (Signed)
You have been seen in the Emergency Department (ED) for abdominal pain.  Your evaluation did not identify a clear cause of your symptoms but was generally reassuring.  We will treat you for a urinary tract infection since this has been a problem for you in the past.   Please follow up as instructed above regarding today?s emergent visit and the symptoms that are bothering you.  Return to the ED if your abdominal pain worsens or fails to improve, you develop bloody vomiting, bloody diarrhea, you are unable to tolerate fluids due to vomiting, fever greater than 101, or other symptoms that concern you.

## 2016-09-15 NOTE — ED Notes (Signed)
Pt discharged to home.  Family member driving.  Discharge instructions reviewed.  Verbalized understanding.  No questions or concerns at this time.  Teach back verified.  Pt in NAD.  No items left in ED.   

## 2016-09-16 LAB — URINE CULTURE
CULTURE: NO GROWTH
SPECIAL REQUESTS: NORMAL

## 2016-09-19 IMAGING — RF DG C-ARM 61-120 MIN
1 series · 1 of 1 positions shown · non-contrast
Comparison: None.

CLINICAL DATA: Thoracic spinal stimulator placement

EXAM:
DG C-ARM 61-120 MIN; THORACIC SPINE - 1 VIEW

[Series 1: run · 1 of 1 slices shown]
[im 1/1]
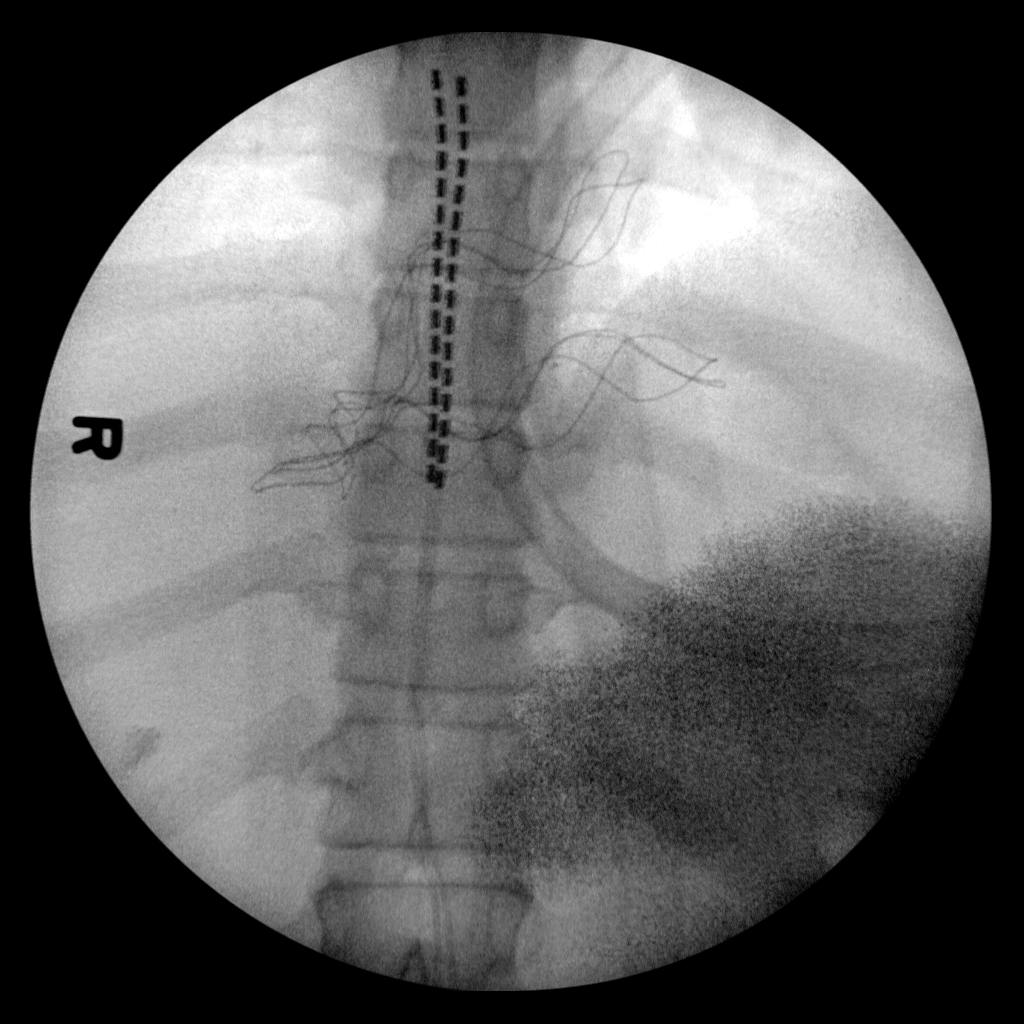

[1 of 1 positions shown; findings below may reference images not displayed]

FLUOROSCOPY TIME:  Radiation Exposure Index (as provided by the
fluoroscopic device): Not available

If the device does not provide the exposure index:

Fluoroscopy Time:  2 minutes and 14 seconds

Number of Acquired Images:  1
FINDINGS: Single spot film shows a spinal stimulator over the lower thoracic
spine. Correlation with the operative findings is recommended.
IMPRESSION: Thoracic spinal stimulator placement

## 2016-10-05 ENCOUNTER — Other Ambulatory Visit: Payer: Self-pay | Admitting: Neurological Surgery

## 2016-10-25 ENCOUNTER — Other Ambulatory Visit: Payer: Self-pay | Admitting: Neurological Surgery

## 2016-10-25 NOTE — Pre-Procedure Instructions (Signed)
Robin MartinezSarah N Walton  10/25/2016      [id number] [place] - [address] AT [facility] [address] [place] [address] Phone: [phone] Fax: [phone]    Your procedure is scheduled on Fri. May 25  [facility] Admitting at 1:10 P.M.  Call this number if you have problems the morning of surgery:  [phone]   Remember:  Do not eat food or drink liquids after midnight Thurs. May 24   Take these medicines the morning of surgery with A SIP OF WATER : dilaudid if needed               1 week prior to surgery stop: ibuprofen, advil, motrin, aleve, ibuprofen, vitamins/herbal medicines.   Do not wear jewelry, make-up or nail polish.  Do not wear lotions, powders, or perfumes, or deoderant.  Do not shave 48 hours prior to surgery.  Men may shave face and neck.  Do not bring valuables to the hospital.  [facility] is not responsible for any belongings or valuables.  Contacts, dentures or bridgework may not be worn into surgery.  Leave your suitcase in the car.  After surgery it may be brought to your room.  For patients admitted to the hospital, discharge time will be determined by your treatment team.  Patients discharged the day of surgery will not be allowed to drive home.    Special instructions:  [place]- Preparing For Surgery  Before surgery, you can play an important role. Because skin is not sterile, your skin needs to be as free of germs as possible. You can reduce the number of germs on your skin by washing with CHG (chlorahexidine gluconate) Soap before surgery.  CHG is an antiseptic cleaner which kills germs and bonds with the skin to continue killing germs even after washing.  Please do not use if you have an allergy to CHG or antibacterial soaps. If your skin becomes reddened/irritated stop using the CHG.  Do not shave (including legs and underarms) for at least 48 hours prior to first CHG shower. It is  OK to shave your face.  Please follow these instructions carefully.   1. Shower the NIGHT BEFORE SURGERY and the MORNING OF SURGERY with CHG.   2. If you chose to wash your hair, wash your hair first as usual with your normal shampoo.  3. After you shampoo, rinse your hair and body thoroughly to remove the shampoo.  4. Use CHG as you would any other liquid soap. You can apply CHG directly to the skin and wash gently with a scrungie or a clean washcloth.   5. Apply the CHG Soap to your body ONLY FROM THE NECK DOWN.  Do not use on open wounds or open sores. Avoid contact with your eyes, ears, mouth and genitals (private parts). Wash genitals (private parts) with your normal soap.  6. Wash thoroughly, paying special attention to the area where your surgery will be performed.  7. Thoroughly rinse your body with warm water from the neck down.  8. DO NOT shower/wash with your normal soap after using and rinsing off the CHG Soap.  9. Pat yourself dry with a CLEAN TOWEL.   10. Wear CLEAN PAJAMAS   11. Place CLEAN SHEETS on your bed the night of your first shower and DO NOT SLEEP WITH PETS.    Day of Surgery: Do not apply any deodorants/lotions. Please wear clean clothes to the  hospital/surgery center.      Please read over the following fact sheets that you were given. Coughing and Deep Breathing, MRSA Information and Surgical Site Infection Prevention

## 2016-10-26 ENCOUNTER — Ambulatory Visit (HOSPITAL_COMMUNITY)
Admission: RE | Admit: 2016-10-26 | Discharge: 2016-10-26 | Disposition: A | Discharge status: Home / Self Care | Payer: BLUE CROSS/BLUE SHIELD | Source: Ambulatory Visit | Attending: Neurological Surgery | Admitting: Neurological Surgery

## 2016-10-26 ENCOUNTER — Encounter (HOSPITAL_COMMUNITY)
Admission: RE | Admit: 2016-10-26 | Discharge: 2016-10-26 | Disposition: A | Discharge status: Home / Self Care | Payer: BLUE CROSS/BLUE SHIELD | Source: Ambulatory Visit | Attending: Neurological Surgery | Admitting: Neurological Surgery

## 2016-10-26 ENCOUNTER — Encounter (HOSPITAL_COMMUNITY): Payer: Self-pay

## 2016-10-26 DIAGNOSIS — Z01812 Encounter for preprocedural laboratory examination: Secondary | ICD-10-CM | POA: Insufficient documentation

## 2016-10-26 DIAGNOSIS — Z01810 Encounter for preprocedural cardiovascular examination: Secondary | ICD-10-CM | POA: Insufficient documentation

## 2016-10-26 DIAGNOSIS — M4316 Spondylolisthesis, lumbar region: Secondary | ICD-10-CM | POA: Insufficient documentation

## 2016-10-26 DIAGNOSIS — M4310 Spondylolisthesis, site unspecified: Secondary | ICD-10-CM

## 2016-10-26 HISTORY — DX: Family history of other specified conditions: Z84.89

## 2016-10-26 LAB — TYPE AND SCREEN
ABO/RH(D): B POS
ANTIBODY SCREEN: NEGATIVE

## 2016-10-26 LAB — BASIC METABOLIC PANEL
ANION GAP: 8 (ref 5–15)
BUN: 8 mg/dL (ref 6–20)
CHLORIDE: 104 mmol/L (ref 101–111)
CO2: 27 mmol/L (ref 22–32)
Calcium: 8.9 mg/dL (ref 8.9–10.3)
Creatinine, Ser: 0.82 mg/dL (ref 0.44–1.00)
GFR calc Af Amer: >60 mL/min (ref >60)
GFR calc non Af Amer: >60 mL/min (ref >60)
Glucose, Bld: 76 mg/dL (ref 65–99)
POTASSIUM: 3.5 mmol/L (ref 3.5–5.1)
SODIUM: 139 mmol/L (ref 135–145)

## 2016-10-26 LAB — CBC WITH DIFFERENTIAL/PLATELET
BASOS ABS: 0 10*3/uL (ref 0.0–0.1)
Basophils Relative: 1 %
EOS PCT: 6 %
Eosinophils Absolute: 0.5 10*3/uL (ref 0.0–0.7)
HEMATOCRIT: 40.6 % (ref 36.0–46.0)
HEMOGLOBIN: 13 g/dL (ref 12.0–15.0)
LYMPHS ABS: 2 10*3/uL (ref 0.7–4.0)
LYMPHS PCT: 24 %
MCH: 27.1 pg (ref 26.0–34.0)
MCHC: 32 g/dL (ref 30.0–36.0)
MCV: 84.6 fL (ref 78.0–100.0)
Monocytes Absolute: 0.6 10*3/uL (ref 0.1–1.0)
Monocytes Relative: 7 %
NEUTROS ABS: 5.3 10*3/uL (ref 1.7–7.7)
Neutrophils Relative %: 62 %
PLATELETS: 276 10*3/uL (ref 150–400)
RBC: 4.8 MIL/uL (ref 3.87–5.11)
RDW: 13.5 % (ref 11.5–15.5)
WBC: 8.4 10*3/uL (ref 4.0–10.5)

## 2016-10-26 LAB — SURGICAL PCR SCREEN
MRSA, PCR: NEGATIVE
Staphylococcus aureus: POSITIVE — AB

## 2016-10-26 LAB — PROTIME-INR
INR: 1.05
Prothrombin Time: 13.8 seconds (ref 11.4–15.2)

## 2016-10-26 LAB — HCG, SERUM, QUALITATIVE: PREG SERUM: NEGATIVE

## 2016-10-26 NOTE — Progress Notes (Signed)
PCP: Lindaann PascalScott Long, PA @ [facility]

## 2016-11-02 NOTE — Anesthesia Preprocedure Evaluation (Addendum)
Anesthesia Evaluation  Patient identified by MRN, date of birth, ID band Patient awake    Reviewed: Allergy & Precautions, H&P , NPO status , Patient's Chart, lab work & pertinent test results  Airway Mallampati: III  TM Distance: >3 FB Neck ROM: Full    Dental no notable dental hx. (+) Poor Dentition, Dental Advisory Given   Pulmonary Current Smoker,    Pulmonary exam normal breath sounds clear to auscultation       Cardiovascular Exercise Tolerance: Good negative cardio ROS   Rhythm:Regular Rate:Normal     Neuro/Psych negative neurological ROS  negative psych ROS   GI/Hepatic negative GI ROS, Neg liver ROS,   Endo/Other  negative endocrine ROS  Renal/GU negative Renal ROS  negative genitourinary   Musculoskeletal   Abdominal   Peds  Hematology negative hematology ROS (+)   Anesthesia Other Findings   Reproductive/Obstetrics negative OB ROS                            Anesthesia Physical Anesthesia Plan  ASA: II  Anesthesia Plan: General   Post-op Pain Management:    Induction: Intravenous  Airway Management Planned: Oral ETT  Additional Equipment:   Intra-op Plan:   Post-operative Plan: Extubation in OR  Informed Consent: I have reviewed the patients History and Physical, chart, labs and discussed the procedure including the risks, benefits and alternatives for the proposed anesthesia with the patient or authorized representative who has indicated his/her understanding and acceptance.   Dental advisory given  Plan Discussed with: CRNA  Anesthesia Plan Comments:         Anesthesia Quick Evaluation

## 2016-11-03 ENCOUNTER — Encounter (HOSPITAL_COMMUNITY): Payer: Self-pay | Admitting: Neurological Surgery

## 2016-11-03 ENCOUNTER — Inpatient Hospital Stay (HOSPITAL_COMMUNITY): Payer: BLUE CROSS/BLUE SHIELD

## 2016-11-03 ENCOUNTER — Inpatient Hospital Stay (HOSPITAL_COMMUNITY): Payer: BLUE CROSS/BLUE SHIELD | Admitting: Certified Registered Nurse Anesthetist

## 2016-11-03 ENCOUNTER — Inpatient Hospital Stay (HOSPITAL_COMMUNITY)
Admission: RE | Admit: 2016-11-03 | Discharge: 2016-11-04 | DRG: 460 | Disposition: A | Discharge status: Home / Self Care | Payer: BLUE CROSS/BLUE SHIELD | Source: Ambulatory Visit | Attending: Neurological Surgery | Admitting: Neurological Surgery

## 2016-11-03 ENCOUNTER — Encounter (HOSPITAL_COMMUNITY)
Admission: RE | Disposition: A | Discharge status: Home / Self Care | Payer: Self-pay | Source: Ambulatory Visit | Attending: Neurological Surgery

## 2016-11-03 DIAGNOSIS — Z880 Allergy status to penicillin: Secondary | ICD-10-CM | POA: Diagnosis not present

## 2016-11-03 DIAGNOSIS — Z981 Arthrodesis status: Secondary | ICD-10-CM

## 2016-11-03 DIAGNOSIS — F17210 Nicotine dependence, cigarettes, uncomplicated: Secondary | ICD-10-CM | POA: Diagnosis present

## 2016-11-03 DIAGNOSIS — M545 Low back pain: Secondary | ICD-10-CM | POA: Diagnosis present

## 2016-11-03 DIAGNOSIS — M4316 Spondylolisthesis, lumbar region: Secondary | ICD-10-CM | POA: Diagnosis present

## 2016-11-03 DIAGNOSIS — Z9109 Other allergy status, other than to drugs and biological substances: Secondary | ICD-10-CM | POA: Diagnosis not present

## 2016-11-03 DIAGNOSIS — Z885 Allergy status to narcotic agent status: Secondary | ICD-10-CM | POA: Diagnosis not present

## 2016-11-03 DIAGNOSIS — Z419 Encounter for procedure for purposes other than remedying health state, unspecified: Secondary | ICD-10-CM

## 2016-11-03 DIAGNOSIS — E282 Polycystic ovarian syndrome: Secondary | ICD-10-CM | POA: Diagnosis present

## 2016-11-03 SURGERY — POSTERIOR LUMBAR FUSION 1 LEVEL
Anesthesia: General | Site: Back

## 2016-11-03 MED ORDER — FENTANYL CITRATE (PF) 250 MCG/5ML IJ SOLN
INTRAMUSCULAR | Status: AC
  Filled 2016-11-03: qty 5

## 2016-11-03 MED ORDER — METHOCARBAMOL 1000 MG/10ML IJ SOLN
500.0000 mg | Freq: Four times a day (QID) | INTRAVENOUS | Status: DC | PRN
  Filled 2016-11-03: qty 5

## 2016-11-03 MED ORDER — VANCOMYCIN HCL IN DEXTROSE 1-5 GM/200ML-% IV SOLN
1000.0000 mg | INTRAVENOUS | Status: AC
  Administered 2016-11-03: 1000 mg via INTRAVENOUS
  Filled 2016-11-03: qty 200

## 2016-11-03 MED ORDER — VANCOMYCIN HCL 1000 MG IV SOLR
INTRAVENOUS | Status: DC | PRN
  Administered 2016-11-03: 1000 mg via TOPICAL

## 2016-11-03 MED ORDER — LIDOCAINE HCL (CARDIAC) 20 MG/ML IV SOLN
INTRAVENOUS | Status: DC | PRN
  Administered 2016-11-03: 60 mg via INTRAVENOUS

## 2016-11-03 MED ORDER — MIDAZOLAM HCL 2 MG/2ML IJ SOLN
INTRAMUSCULAR | Status: AC
  Filled 2016-11-03: qty 2

## 2016-11-03 MED ORDER — HYDROMORPHONE HCL 1 MG/ML IJ SOLN
1.0000 mg | INTRAMUSCULAR | Status: DC | PRN
  Administered 2016-11-03 (×2): 1 mg via INTRAVENOUS
  Filled 2016-11-03 (×2): qty 1

## 2016-11-03 MED ORDER — ONDANSETRON HCL 4 MG/2ML IJ SOLN
INTRAMUSCULAR | Status: DC | PRN
  Administered 2016-11-03: 4 mg via INTRAVENOUS

## 2016-11-03 MED ORDER — ROCURONIUM BROMIDE 100 MG/10ML IV SOLN
INTRAVENOUS | Status: DC | PRN
  Administered 2016-11-03: 10 mg via INTRAVENOUS
  Administered 2016-11-03: 50 mg via INTRAVENOUS
  Administered 2016-11-03: 15 mg via INTRAVENOUS

## 2016-11-03 MED ORDER — VANCOMYCIN HCL 1000 MG IV SOLR
INTRAVENOUS | Status: AC
  Filled 2016-11-03: qty 1000

## 2016-11-03 MED ORDER — SODIUM CHLORIDE 0.9 % IV SOLN: 250.0000 mL | INTRAVENOUS | Status: DC

## 2016-11-03 MED ORDER — NEOSTIGMINE METHYLSULFATE 10 MG/10ML IV SOLN
INTRAVENOUS | Status: DC | PRN
  Administered 2016-11-03: 4 mg via INTRAVENOUS

## 2016-11-03 MED ORDER — SENNA 8.6 MG PO TABS
1.0000 | ORAL_TABLET | Freq: Two times a day (BID) | ORAL | Status: DC
  Administered 2016-11-03: 8.6 mg via ORAL
  Filled 2016-11-03: qty 1

## 2016-11-03 MED ORDER — PROPOFOL 10 MG/ML IV BOLUS
INTRAVENOUS | Status: DC | PRN
  Administered 2016-11-03: 130 mg via INTRAVENOUS

## 2016-11-03 MED ORDER — BUPIVACAINE HCL (PF) 0.25 % IJ SOLN
INTRAMUSCULAR | Status: AC
  Filled 2016-11-03: qty 30

## 2016-11-03 MED ORDER — BUPIVACAINE HCL (PF) 0.25 % IJ SOLN
INTRAMUSCULAR | Status: DC | PRN
  Administered 2016-11-03: 5 mL

## 2016-11-03 MED ORDER — SCOPOLAMINE 1 MG/3DAYS TD PT72
MEDICATED_PATCH | TRANSDERMAL | Status: DC | PRN
  Administered 2016-11-03: 1 via TRANSDERMAL

## 2016-11-03 MED ORDER — ACETAMINOPHEN 325 MG PO TABS: 650.0000 mg | ORAL_TABLET | ORAL | Status: DC | PRN

## 2016-11-03 MED ORDER — PHENYLEPHRINE 40 MCG/ML (10ML) SYRINGE FOR IV PUSH (FOR BLOOD PRESSURE SUPPORT)
PREFILLED_SYRINGE | INTRAVENOUS | Status: AC
  Filled 2016-11-03: qty 10

## 2016-11-03 MED ORDER — ONDANSETRON HCL 4 MG/2ML IJ SOLN: 4.0000 mg | Freq: Four times a day (QID) | INTRAMUSCULAR | Status: DC | PRN

## 2016-11-03 MED ORDER — DEXAMETHASONE SODIUM PHOSPHATE 10 MG/ML IJ SOLN
INTRAMUSCULAR | Status: AC
  Filled 2016-11-03: qty 1

## 2016-11-03 MED ORDER — 0.9 % SODIUM CHLORIDE (POUR BTL) OPTIME
TOPICAL | Status: DC | PRN
  Administered 2016-11-03: 1000 mL

## 2016-11-03 MED ORDER — SODIUM CHLORIDE 0.9% FLUSH: 3.0000 mL | INTRAVENOUS | Status: DC | PRN

## 2016-11-03 MED ORDER — VANCOMYCIN HCL IN DEXTROSE 1-5 GM/200ML-% IV SOLN
1000.0000 mg | Freq: Once | INTRAVENOUS | Status: AC
  Administered 2016-11-03: 1000 mg via INTRAVENOUS
  Filled 2016-11-03: qty 200

## 2016-11-03 MED ORDER — LACTATED RINGERS IV SOLN
INTRAVENOUS | Status: DC | PRN
  Administered 2016-11-03: 11:00:00
  Administered 2016-11-03 (×2): via INTRAVENOUS

## 2016-11-03 MED ORDER — HYDROMORPHONE HCL 1 MG/ML IJ SOLN
INTRAMUSCULAR | Status: AC
  Filled 2016-11-03: qty 0.5

## 2016-11-03 MED ORDER — NEOSTIGMINE METHYLSULFATE 5 MG/5ML IV SOSY
PREFILLED_SYRINGE | INTRAVENOUS | Status: AC
  Filled 2016-11-03: qty 5

## 2016-11-03 MED ORDER — THROMBIN 5000 UNITS EX SOLR
CUTANEOUS | Status: AC
  Filled 2016-11-03: qty 5000

## 2016-11-03 MED ORDER — CHLORHEXIDINE GLUCONATE CLOTH 2 % EX PADS: 6.0000 | MEDICATED_PAD | Freq: Once | CUTANEOUS | Status: DC

## 2016-11-03 MED ORDER — PHENOL 1.4 % MT LIQD: 1.0000 | OROMUCOSAL | Status: DC | PRN

## 2016-11-03 MED ORDER — ONDANSETRON HCL 4 MG PO TABS: 4.0000 mg | ORAL_TABLET | Freq: Four times a day (QID) | ORAL | Status: DC | PRN

## 2016-11-03 MED ORDER — POTASSIUM CHLORIDE IN NACL 20-0.9 MEQ/L-% IV SOLN: INTRAVENOUS | Status: DC

## 2016-11-03 MED ORDER — METHOCARBAMOL 500 MG PO TABS
500.0000 mg | ORAL_TABLET | Freq: Four times a day (QID) | ORAL | Status: DC | PRN
  Administered 2016-11-03 – 2016-11-04 (×2): 500 mg via ORAL
  Filled 2016-11-03 (×2): qty 1

## 2016-11-03 MED ORDER — SODIUM CHLORIDE 0.9 % IR SOLN
Status: DC | PRN
  Administered 2016-11-03: 09:00:00

## 2016-11-03 MED ORDER — SCOPOLAMINE 1 MG/3DAYS TD PT72
MEDICATED_PATCH | TRANSDERMAL | Status: AC
  Filled 2016-11-03: qty 1

## 2016-11-03 MED ORDER — MENTHOL 3 MG MT LOZG: 1.0000 | LOZENGE | OROMUCOSAL | Status: DC | PRN

## 2016-11-03 MED ORDER — PROPOFOL 10 MG/ML IV BOLUS
INTRAVENOUS | Status: AC
  Filled 2016-11-03: qty 20

## 2016-11-03 MED ORDER — THROMBIN 5000 UNITS EX SOLR
OROMUCOSAL | Status: DC | PRN
  Administered 2016-11-03: 09:00:00 via TOPICAL

## 2016-11-03 MED ORDER — ACETAMINOPHEN 10 MG/ML IV SOLN
INTRAVENOUS | Status: DC | PRN
  Administered 2016-11-03: 1000 mg via INTRAVENOUS

## 2016-11-03 MED ORDER — FENTANYL CITRATE (PF) 100 MCG/2ML IJ SOLN
INTRAMUSCULAR | Status: DC | PRN
  Administered 2016-11-03: 100 ug via INTRAVENOUS
  Administered 2016-11-03 (×2): 50 ug via INTRAVENOUS

## 2016-11-03 MED ORDER — ACETAMINOPHEN 650 MG RE SUPP: 650.0000 mg | RECTAL | Status: DC | PRN

## 2016-11-03 MED ORDER — ONDANSETRON HCL 4 MG/2ML IJ SOLN
INTRAMUSCULAR | Status: AC
  Filled 2016-11-03: qty 2

## 2016-11-03 MED ORDER — MIDAZOLAM HCL 5 MG/5ML IJ SOLN
INTRAMUSCULAR | Status: DC | PRN
  Administered 2016-11-03: 2 mg via INTRAVENOUS

## 2016-11-03 MED ORDER — HYDROMORPHONE HCL 2 MG PO TABS
4.0000 mg | ORAL_TABLET | ORAL | Status: DC | PRN
  Administered 2016-11-03 – 2016-11-04 (×3): 4 mg via ORAL
  Filled 2016-11-03 (×3): qty 2

## 2016-11-03 MED ORDER — HYDROMORPHONE HCL 1 MG/ML IJ SOLN
0.2500 mg | INTRAMUSCULAR | Status: DC | PRN
  Administered 2016-11-03 (×4): 0.5 mg via INTRAVENOUS

## 2016-11-03 MED ORDER — THROMBIN 20000 UNITS EX SOLR
CUTANEOUS | Status: AC
  Filled 2016-11-03: qty 20000

## 2016-11-03 MED ORDER — ACETAMINOPHEN 10 MG/ML IV SOLN
INTRAVENOUS | Status: AC
  Filled 2016-11-03: qty 100

## 2016-11-03 MED ORDER — SODIUM CHLORIDE 0.9% FLUSH
3.0000 mL | Freq: Two times a day (BID) | INTRAVENOUS | Status: DC
  Administered 2016-11-03: 3 mL via INTRAVENOUS

## 2016-11-03 MED ORDER — GLYCOPYRROLATE 0.2 MG/ML IJ SOLN
INTRAMUSCULAR | Status: DC | PRN
  Administered 2016-11-03: 0.6 mg via INTRAVENOUS
  Administered 2016-11-03: 0.2 mg via INTRAVENOUS

## 2016-11-03 MED ORDER — SURGIFOAM 100 EX MISC
CUTANEOUS | Status: DC | PRN
  Administered 2016-11-03: 09:00:00 via TOPICAL

## 2016-11-03 MED ORDER — PHENYLEPHRINE 40 MCG/ML (10ML) SYRINGE FOR IV PUSH (FOR BLOOD PRESSURE SUPPORT)
PREFILLED_SYRINGE | INTRAVENOUS | Status: DC | PRN
  Administered 2016-11-03: 80 ug via INTRAVENOUS
  Administered 2016-11-03: 40 ug via INTRAVENOUS
  Administered 2016-11-03 (×2): 80 ug via INTRAVENOUS

## 2016-11-03 MED ORDER — CELECOXIB 200 MG PO CAPS
200.0000 mg | ORAL_CAPSULE | Freq: Two times a day (BID) | ORAL | Status: DC
  Administered 2016-11-03 (×2): 200 mg via ORAL
  Filled 2016-11-03 (×2): qty 1

## 2016-11-03 MED ORDER — NICOTINE 21 MG/24HR TD PT24
21.0000 mg | MEDICATED_PATCH | TRANSDERMAL | Status: DC
  Administered 2016-11-03: 21 mg via TRANSDERMAL
  Filled 2016-11-03: qty 1

## 2016-11-03 MED ORDER — DEXAMETHASONE SODIUM PHOSPHATE 10 MG/ML IJ SOLN
10.0000 mg | INTRAMUSCULAR | Status: AC
  Administered 2016-11-03: 10 mg via INTRAVENOUS
  Filled 2016-11-03: qty 1

## 2016-11-03 SURGICAL SUPPLY — 57 items
BAG DECANTER FOR FLEXI CONT (MISCELLANEOUS) ×2 IMPLANT
BASKET BONE COLLECTION (BASKET) ×2 IMPLANT
BENZOIN TINCTURE PRP APPL 2/3 (GAUZE/BANDAGES/DRESSINGS) ×2 IMPLANT
BLADE CLIPPER SURG (BLADE) IMPLANT
BONE CANC CHIPS 40CC CAN1/2 (Bone Implant) ×2 IMPLANT
BUR MATCHSTICK NEURO 3.0 LAGG (BURR) ×2 IMPLANT
CANISTER SUCT 3000ML PPV (MISCELLANEOUS) ×2 IMPLANT
CARTRIDGE OIL MAESTRO DRILL (MISCELLANEOUS) ×1 IMPLANT
CHIPS CANC BONE 40CC CAN1/2 (Bone Implant) ×1 IMPLANT
CONT SPEC 4OZ CLIKSEAL STRL BL (MISCELLANEOUS) ×2 IMPLANT
COVER BACK TABLE 60X90IN (DRAPES) ×2 IMPLANT
DERMABOND ADVANCED (GAUZE/BANDAGES/DRESSINGS) ×2
DERMABOND ADVANCED .7 DNX12 (GAUZE/BANDAGES/DRESSINGS) ×2 IMPLANT
DIFFUSER DRILL AIR PNEUMATIC (MISCELLANEOUS) ×2 IMPLANT
DRAPE C-ARM 42X72 X-RAY (DRAPES) ×4 IMPLANT
DRAPE LAPAROTOMY 100X72X124 (DRAPES) ×2 IMPLANT
DRAPE POUCH INSTRU U-SHP 10X18 (DRAPES) ×2 IMPLANT
DRAPE SURG 17X23 STRL (DRAPES) ×2 IMPLANT
DURAPREP 26ML APPLICATOR (WOUND CARE) ×2 IMPLANT
ELECT REM PT RETURN 9FT ADLT (ELECTROSURGICAL) ×2
ELECTRODE REM PT RTRN 9FT ADLT (ELECTROSURGICAL) ×1 IMPLANT
EVACUATOR 1/8 PVC DRAIN (DRAIN) IMPLANT
GAUZE SPONGE 4X4 16PLY XRAY LF (GAUZE/BANDAGES/DRESSINGS) IMPLANT
GLOVE BIO SURGEON STRL SZ7 (GLOVE) IMPLANT
GLOVE BIO SURGEON STRL SZ8 (GLOVE) ×4 IMPLANT
GLOVE BIOGEL PI IND STRL 7.0 (GLOVE) IMPLANT
GLOVE BIOGEL PI INDICATOR 7.0 (GLOVE)
GOWN STRL REUS W/ TWL LRG LVL3 (GOWN DISPOSABLE) IMPLANT
GOWN STRL REUS W/ TWL XL LVL3 (GOWN DISPOSABLE) ×2 IMPLANT
GOWN STRL REUS W/TWL 2XL LVL3 (GOWN DISPOSABLE) IMPLANT
GOWN STRL REUS W/TWL LRG LVL3 (GOWN DISPOSABLE)
GOWN STRL REUS W/TWL XL LVL3 (GOWN DISPOSABLE) ×2
HEMOSTAT POWDER KIT SURGIFOAM (HEMOSTASIS) IMPLANT
KIT BASIN OR (CUSTOM PROCEDURE TRAY) ×2 IMPLANT
KIT ROOM TURNOVER OR (KITS) ×2 IMPLANT
NEEDLE ASP BONE MRW 8GX15 (NEEDLE) ×2 IMPLANT
NEEDLE HYPO 25X1 1.5 SAFETY (NEEDLE) ×2 IMPLANT
NS IRRIG 1000ML POUR BTL (IV SOLUTION) ×2 IMPLANT
OIL CARTRIDGE MAESTRO DRILL (MISCELLANEOUS) ×2
PACK LAMINECTOMY NEURO (CUSTOM PROCEDURE TRAY) ×2 IMPLANT
PAD ARMBOARD 7.5X6 YLW CONV (MISCELLANEOUS) ×6 IMPLANT
ROD PC 5.5X35 TI ARSENAL (Rod) ×4 IMPLANT
SCREW CBX 5.0X30MM (Screw) ×8 IMPLANT
SCREW SET SPINAL ARSENAL 47127 (Screw) ×8 IMPLANT
SPONGE LAP 4X18 X RAY DECT (DISPOSABLE) IMPLANT
SPONGE SURGIFOAM ABS GEL 100 (HEMOSTASIS) ×2 IMPLANT
STRIP BIOACTIVE 20CC 25X100X8 (Miscellaneous) ×2 IMPLANT
STRIP CLOSURE SKIN 1/2X4 (GAUZE/BANDAGES/DRESSINGS) ×4 IMPLANT
SUT VIC AB 0 CT1 18XCR BRD8 (SUTURE) ×1 IMPLANT
SUT VIC AB 0 CT1 8-18 (SUTURE) ×1
SUT VIC AB 2-0 CP2 18 (SUTURE) ×2 IMPLANT
SUT VIC AB 3-0 SH 8-18 (SUTURE) ×4 IMPLANT
SYR CONTROL 10ML LL (SYRINGE) ×2 IMPLANT
TOWEL GREEN STERILE (TOWEL DISPOSABLE) ×2 IMPLANT
TOWEL GREEN STERILE FF (TOWEL DISPOSABLE) ×2 IMPLANT
TRAY FOLEY W/METER SILVER 16FR (SET/KITS/TRAYS/PACK) ×2 IMPLANT
WATER STERILE IRR 1000ML POUR (IV SOLUTION) ×2 IMPLANT

## 2016-11-03 NOTE — Anesthesia Postprocedure Evaluation (Signed)
Anesthesia Post Note  Patient: Robin MartinezSarah N Taylor  Procedure(s) Performed: Procedure(s) (LRB): Posterolateral Fusion - Lumbar four-Lumbar five with pedicle screw fixation with autograph with bone marrow aspiration (N/A)  Patient location during evaluation: PACU Anesthesia Type: General Level of consciousness: awake and alert Pain management: pain level controlled Vital Signs Assessment: post-procedure vital signs reviewed and stable Respiratory status: spontaneous breathing, nonlabored ventilation, respiratory function stable and patient connected to nasal cannula oxygen Cardiovascular status: blood pressure returned to baseline and stable Postop Assessment: no signs of nausea or vomiting Anesthetic complications: no       Last Vitals:  Vitals:   11/03/16 1200 11/03/16 1235  BP:  95/61  Pulse: 85 74  Resp: 15 18  Temp: 36.6 C 36.9 C    Last Pain:  Vitals:   11/03/16 1130  TempSrc:   PainSc: 10-Worst pain ever                 Rebel Laughridge,W. EDMOND

## 2016-11-03 NOTE — Anesthesia Procedure Notes (Signed)
Procedure Name: Intubation Date/Time: 11/03/2016 7:44 AM Performed by: Rejeana Brock L Pre-anesthesia Checklist: Patient identified, Emergency Drugs available, Suction available and Patient being monitored Patient Re-evaluated:Patient Re-evaluated prior to inductionOxygen Delivery Method: Circle System Utilized Preoxygenation: Pre-oxygenation with 100% oxygen Intubation Type: IV induction Ventilation: Mask ventilation without difficulty Laryngoscope Size: Mac and 3 Grade View: Grade II Tube type: Oral Tube size: 7.0 mm Number of attempts: 1 Airway Equipment and Method: Stylet and Oral airway Placement Confirmation: ETT inserted through vocal cords under direct vision,  positive ETCO2 and breath sounds checked- equal and bilateral Secured at: 22 cm Tube secured with: Tape Dental Injury: Teeth and Oropharynx as per pre-operative assessment

## 2016-11-03 NOTE — Evaluation (Signed)
Physical Therapy Evaluation Patient Details Name: [name] MRN: [id number] DOB: Mar 02, 1986 Today's Date: 11/03/2016   History of Present Illness  Pt is s/p L4-5 intertransverse arthrodesis and posterior fixation. PMH includes spinal cord stimulator insertion and previous back surgery.   Clinical Impression  Patient is s/p above surgery resulting in the deficits listed below (see PT Problem List). PTA, pt reports she used a cane for ambulation and required assist from husband for bathing and dressing. Upon evaluation, pt limited by post op pain, weakness in BLE from before surgery, and decreased balance. Pt requiring HHA and min A for ambulation. Educated about use of RW to increase stability with ambulation; will attempt during next session. Follow up and DME recommendations below. Patient will benefit from skilled PT to increase their independence and safety with mobility (while adhering to their precautions) to allow discharge to the venue listed below. Will continue to follow to maximize functional mobility independence.      Follow Up Recommendations No PT follow up;Supervision - Intermittent    Equipment Recommendations  Rolling walker with 5" wheels;3in1 (PT)    Recommendations for Other Services       Precautions / Restrictions Precautions Precautions: Back Precaution Booklet Issued: Yes (comment) Precaution Comments: Reviewed precaution handout with patient and educated about importance of maintaining precautions during mobility tasks.  Required Braces or Orthoses: Spinal Brace Spinal Brace: Lumbar corset;Applied in standing position Restrictions Weight Bearing Restrictions: No      Mobility  Bed Mobility Overal bed mobility: Needs Assistance Bed Mobility: Rolling;Sit to Sidelying Rolling: Min assist       Sit to sidelying: Min assist General bed mobility comments: Min A lowering trunk during sit>sidelying and min A for mataining log roll during rolling. Pt  asking importance of log roll technique, given she has gotten into a particular way. Educated about importance of maintaining precautions to protect surgery site.   Transfers Overall transfer level: Needs assistance Equipment used: 1 person hand held assist Transfers: Sit to/from Stand Sit to Stand: Min assist         General transfer comment: Min A for lift assist for repositioning in bed.   Ambulation/Gait Ambulation/Gait assistance: Min assist Ambulation Distance (Feet): 30 Feet Assistive device: 1 person hand held assist Gait Pattern/deviations: Step-through pattern;Decreased stride length;Antalgic Gait velocity: Decreased Gait velocity interpretation: Below normal speed for age/gender General Gait Details: Pt received with NT in hallway. Slow, unsteady gait secondary to baseline weakness. Pt requiring HHA for steadying. Educated about use of RW and will attempt tomorrow. Pt reporting increased pain with ambulation and did not want to attempt gait with RW this session.   Stairs            Wheelchair Mobility    Modified Rankin (Stroke Patients Only)       Balance Overall balance assessment: Needs assistance Sitting-balance support: No upper extremity supported;Feet supported Sitting balance-Leahy Scale: Good     Standing balance support: Single extremity supported;During functional activity Standing balance-Leahy Scale: Poor Standing balance comment: Reliant on UE support for steadying                              Pertinent Vitals/Pain Pain Assessment: Faces Faces Pain Scale: Hurts even more Pain Location: back  Pain Descriptors / Indicators: Aching;Sore;Operative site guarding Pain Intervention(s): Limited activity within patient's tolerance;Monitored during session;Repositioned;Patient requesting pain meds-RN notified    Home Living Family/patient expects to be discharged to::  Private residence Living Arrangements: Spouse/significant  other;Children Available Help at Discharge: Family;Available PRN/intermittently Type of Home: House Home Access: Stairs to enter Entrance Stairs-Rails: None Entrance Stairs-Number of Steps: 1 (threshold step ) Home Layout: Two level;Able to live on main level with bedroom/bathroom Home Equipment: Gilmer Morane - single point      Prior Function Level of Independence: Needs assistance   Gait / Transfers Assistance Needed: Had cane at home for ambulation  ADL's / Homemaking Assistance Needed: Required assist from husband for bathing secondary to pain.         Hand Dominance        Extremity/Trunk Assessment   Upper Extremity Assessment Upper Extremity Assessment: Defer to OT evaluation    Lower Extremity Assessment Lower Extremity Assessment: Generalized weakness (At least 3+/5 throughout )    Cervical / Trunk Assessment Cervical / Trunk Assessment: Other exceptions Cervical / Trunk Exceptions: s/p surgery   Communication   Communication: No difficulties  Cognition Arousal/Alertness: Awake/alert Behavior During Therapy: WFL for tasks assessed/performed Overall Cognitive Status: Within Functional Limits for tasks assessed                                        General Comments General comments (skin integrity, edema, etc.): Pt's mother present throughout. Pt getting tearful about not being able to help herself for so long prior to surgery. Worried about not being able to take care of herself at home. Educated about roll of PT and OT in preparing pt for return home after surgery and ensuring safe return home. Educated about generalized walking program to perform at home.     Exercises     Assessment/Plan    PT Assessment Patient needs continued PT services  PT Problem List Decreased strength;Decreased activity tolerance;Decreased balance;Decreased mobility;Decreased knowledge of use of DME;Decreased knowledge of precautions;Pain       PT Treatment  Interventions DME instruction;Gait training;Stair training;Therapeutic activities;Functional mobility training;Therapeutic exercise;Balance training;Neuromuscular re-education;Patient/family education    PT Goals (Current goals can be found in the Care Plan section)  Acute Rehab PT Goals Patient Stated Goal: to be independent  PT Goal Formulation: With patient/family Time For Goal Achievement: 11/10/16 Potential to Achieve Goals: Good    Frequency Min 5X/week   Barriers to discharge        Co-evaluation               AM-PAC PT "6 Clicks" Daily Activity  Outcome Measure Difficulty turning over in bed (including adjusting bedclothes, sheets and blankets)?: Total Difficulty moving from lying on back to sitting on the side of the bed? : Total Difficulty sitting down on and standing up from a chair with arms (e.g., wheelchair, bedside commode, etc,.)?: Total Help needed moving to and from a bed to chair (including a wheelchair)?: A Little Help needed walking in hospital room?: A Little Help needed climbing 3-5 steps with a railing? : A Lot 6 Click Score: 11    End of Session Equipment Utilized During Treatment: Gait belt;Back brace Activity Tolerance: Patient limited by pain Patient left: in bed;with call bell/phone within reach;with family/visitor present Nurse Communication: Mobility status PT Visit Diagnosis: Other abnormalities of gait and mobility (R26.89);Pain Pain - part of body:  (back )    Time: [id number] PT Time Calculation (min) (ACUTE ONLY): 20 min   Charges:   PT Evaluation $PT Eval Low Complexity: 1 Procedure PT Treatments $Gait Training:  8-22 mins   PT G Codes:        Margot Chimes, PT, DPT  Acute Rehabilitation Services  Pager: [phone]   Melvyn Novas 11/03/2016, 4:49 PM

## 2016-11-03 NOTE — H&P (Signed)
Subjective: Patient is a 31 y.o. female admitted for back pain. Onset of symptoms was several months ago, gradually worsening since that time.  The pain is rated intense, unremitting, and is located at the across the lower back and radiates to legs. The pain is described as aching and occurs all day. The symptoms have been progressive. Symptoms are exacerbated by exercise. 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 Has SCS also.   Past Medical History:  Diagnosis Date  . Abscess    right breast  . Anemia    as a child  . Family history of adverse reaction to anesthesia    mother had heart bypass surgery and hard time to wake her up  . Kidney infection 7/15  . Lung collapse 7/15   both lungs collapse, no symptoms,,[facility]ontanous[facility]  . PCOS (polycystic ovarian syndrome)     Past Surgical History:  Procedure Laterality Date  . BACK SURGERY    . BREAST SURGERY Right 9/16   lumpectomy  . CHOLECYSTECTOMY    . SPINAL CORD STIMULATOR INSERTION N/A 08/22/2015   Procedure: LUMBAR SPINAL CORD STIMULATOR INSERTION;  Surgeon: Odette FractionPaul Harkins, MD;  Location: [facility];  Service: Neurosurgery;  Laterality: N/A;    Prior to Admission medications   Medication Sig Start Date End Date Taking? Authorizing Provider  HYDROmorphone (DILAUDID) 4 MG tablet Take 1 tablet (4 mg total) by mouth every 4 (four) hours as needed for moderate pain or severe pain. Patient taking differently: Take 4 mg by mouth 2 (two) times daily as needed for moderate pain or severe pain.  08/22/15  Yes Odette FractionHarkins, Paul, MD  ibuprofen (ADVIL,MOTRIN) 200 MG tablet Take 200-400 mg by mouth daily as needed for headache or moderate pain.   Yes [provider]  cephALEXin (KEFLEX) 500 MG capsule Take 1 capsule (500 mg total) by mouth 2 (two) times daily. Patient not taking: Reported on 10/21/2016 09/15/16   Loleta RoseForbach, Cory, MD   Allergies  Allergen Reactions  . Penicillins Anaphylaxis    PATIENT HAD A PCN REACTION  WITH IMMEDIATE RASH, FACIAL/TONGUE/THROAT SWELLING, SOB, OR LIGHTHEADEDNESS WITH HYPOTENSION:  #  #  #  YES  #  #  #  Has patient had a PCN reaction causing severe rash involving mucus membranes or skin necrosis: No Has patient had a PCN reaction that required hospitalization No Has patient had a PCN reaction occurring within the last 10 years: No If all of the above answers are "NO", then may proceed with Cephalosporin use.   Marland Kitchen. Hydrocodone Nausea And Vomiting  . Morphine And Related Itching and Nausea And Vomiting  . Oxycodone Itching and Nausea And Vomiting  . Tape Rash    Reaction to adhesive on patch used after breast surgery - tegaderm    Social History  Substance Use Topics  . Smoking status: Light Tobacco Smoker    Packs/day: 0.25    Years: 15.00    Types: Cigarettes  . Smokeless tobacco: Never Used  . Alcohol use No    Family History  Problem Relation Age of Onset  . Diabetes Mother   . Heart disease Mother   . COPD Mother   . Cancer Brother   . Bipolar disorder Brother   . Schizophrenia Brother   . Alzheimer's disease Other   . Dementia Other      Review of Systems  Positive ROS: neg  All other systems have been reviewed and were otherwise negative with the exception of those mentioned  in the HPI and as above.  Objective: Vital signs in last 24 hours: Temp:  [97.9 F (36.6 C)] 97.9 F (36.6 C) (05/23 0610) Pulse Rate:  [81] 81 (05/23 0610) Resp:  [18] 18 (05/23 0610) BP: (127)/(67) 127/67 (05/23 0610) SpO2:  [100 %] 100 % (05/23 0610)  General Appearance: Alert, cooperative, no distress, appears stated age Head: Normocephalic, without obvious abnormality, atraumatic Eyes: PERRL, conjunctiva/corneas clear, EOM's intact    Neck: Supple, symmetrical, trachea midline Back: Symmetric, no curvature, ROM normal, no CVA tenderness Lungs:  respirations unlabored Heart: Regular rate and rhythm Abdomen: Soft, non-tender Extremities: Extremities normal,  atraumatic, no cyanosis or edema Pulses: 2+ and symmetric all extremities Skin: Skin color, texture, turgor normal, no rashes or lesions  NEUROLOGIC:   Mental status: Alert and oriented x4,  no aphasia, good attention span, fund of knowledge, and memory Motor Exam - grossly normal Sensory Exam - grossly normal Reflexes: 1+ Coordination - grossly normal Gait - grossly normal Balance - grossly normal Cranial Nerves: I: smell Not tested  II: visual acuity  OS: nl    OD: nl  II: visual fields Full to confrontation  II: pupils Equal, round, reactive to light  III,VII: ptosis None  III,IV,VI: extraocular muscles  Full ROM  V: mastication Normal  V: facial light touch sensation  Normal  V,VII: corneal reflex  Present  VII: facial muscle function - upper  Normal  VII: facial muscle function - lower Normal  VIII: hearing Not tested  IX: soft palate elevation  Normal  IX,X: gag reflex Present  XI: trapezius strength  5/5  XI: sternocleidomastoid strength 5/5  XI: neck flexion strength  5/5  XII: tongue strength  Normal    Data Review Lab Results  Component Value Date   WBC 8.4 10/26/2016   HGB 13.0 10/26/2016   HCT 40.6 10/26/2016   MCV 84.6 10/26/2016   PLT 276 10/26/2016   Lab Results  Component Value Date   NA 139 10/26/2016   K 3.5 10/26/2016   CL 104 10/26/2016   CO2 27 10/26/2016   BUN 8 10/26/2016   CREATININE 0.82 10/26/2016   GLUCOSE 76 10/26/2016   Lab Results  Component Value Date   INR 1.05 10/26/2016    Assessment/Plan: Patient admitted for L4-5 fusion. Patient has failed a reasonable attempt at conservative therapy.  I explained the condition and procedure to the patient and answered any questions.  Patient wishes to proceed with procedure as planned. Understands risks/ benefits and typical outcomes of procedure.   [name] S 11/03/2016 6:24 AM

## 2016-11-03 NOTE — Transfer of Care (Signed)
Immediate Anesthesia Transfer of Care Note  Patient: Robin MartinezSarah N Walton  Procedure(s) Performed: Procedure(s): Posterolateral Fusion - Lumbar four-Lumbar five with pedicle screw fixation with autograph with bone marrow aspiration (N/A)  Patient Location: PACU  Anesthesia Type:General  Level of Consciousness: awake  Airway & Oxygen Therapy: Patient Spontanous Breathing and Patient connected to face mask oxygen  Post-op Assessment: Report given to RN, Post -op Vital signs reviewed and stable and Patient moving all extremities X 4  Post vital signs: Reviewed and stable  Last Vitals:  Vitals:   11/03/16 0610 11/03/16 1010  BP: 127/67   Pulse: 81   Resp: 18   Temp: 36.6 C (P) 36.3 C    Last Pain:  Vitals:   11/03/16 0623  TempSrc:   PainSc: 7       Patients Stated Pain Goal: 4 (11/03/16 [id number])  Complications: No apparent anesthesia complications

## 2016-11-03 NOTE — Op Note (Signed)
11/03/2016  10:11 AM  PATIENT:  Robin MartinezSarah N Walton  31 y.o. 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 Posterior fixation L4-5 using ATEC cortical pedicle screws.  2. Intertransverse arthrodesis L4-5 using morcellized  allograft soaked with bone marrow aspirate obtained through a separate fascial incision over the right iliac crest.  SURGEON:  Marikay Alaravid Jones, MD  ASSISTANTS: Dr Bevely Palmeritty  ANESTHESIA:  General  EBL: 75 ml  Total I/O In: 1700 [I.V.:1700] Out: 425 [Urine:350; Blood:75]  BLOOD ADMINISTERED:none  DRAINS: none   INDICATION FOR PROCEDURE: This patient presented with severe back and leg pain. Imaging revealed post laminectomy spondylolisthesis at L4-5. The patient tried a reasonable attempt at conservative medical measures without relief. I recommended instrumented fusion to address the segmental stability.  Patient understood the risks, benefits, and alternatives and potential outcomes and wished to proceed.  PROCEDURE DETAILS:  The patient was brought to the operating room. After induction of generalized endotracheal anesthesia the patient was rolled into the prone position on chest rolls and all pressure points were padded. The patient's lumbar region was cleaned and then prepped with DuraPrep and draped in the usual sterile fashion. Anesthesia was injected and then a dorsal midline incision was made and carried down to the lumbosacral fascia. The fascia was opened and the paraspinous musculature was taken down in a subperiosteal fashion to expose L4-5. A self-retaining retractor was placed. Intraoperative fluoroscopy confirmed my level, and I started with placement of the L4 and L5 cortical pedicle screws. The pedicle screw entry zones were identified utilizing surface landmarks and  AP and lateral fluoroscopy. I scored the cortex with the high-speed drill and then used the hand  drill to drill an upward and outward direction into the pedicle. I then tapped line to line. I then placed a 5.0 x 35 mm cortical pedicle screw into the pedicles of L4 and L5 bilaterally. I dissected in a suprafascial plane to the right iliac crest. The fascia was opened and I used a Jamshidi needle to extract about 20 mL of bone marrow aspirate from the right iliac crest. The hole was then dried with Surgifoam and the fascia was closed with 0 Vicryl. I then soaked the morcellized allograft with bone marrow aspirate. We then decorticated the transverse processes at L4 and L5 and laid a mixture of morcellized  allograft out over these to perform intertransverse arthrodesis at L4-5 bilaterally. We then placed lordotic rods into the multiaxial screw heads of the pedicle screws and locked these in position with the locking caps and anti-torque device. We then checked our construct with AP and lateral fluoroscopy. Irrigated with copious amounts of bacitracin-containing saline solution. I closed the muscle and the fascia with 0 Vicryl. Closed the subcutaneous tissues with 2-0 Vicryl and subcuticular tissues with 3-0 Vicryl. The skin was closed with Dermabond.  the patient was awakened from general anesthesia and transported to the recovery room in stable condition. At the end of the procedure all sponge, needle and instrument counts were correct.   PLAN OF CARE: admit to inpatient  PATIENT DISPOSITION:  PACU - hemodynamically stable.   Delay start of Pharmacological VTE agent (>24hrs) due to surgical blood loss or risk of bleeding:  yes

## 2016-11-04 NOTE — Evaluation (Signed)
Occupational Therapy Evaluation/Discharge Patient Details Name: Robin MartinezSarah N Walton MRN: [id number] DOB: 1986/03/28 Today's Date: 11/04/2016    History of Present Illness Pt is s/p L4-5 intertransverse arthrodesis and posterior fixation. PMH includes spinal cord stimulator insertion and previous back surgery.    Clinical Impression   PTA, pt was requiring cane for functional mobility and assistance for dressing/bathing from husband due to significant back pain. Pt currently requires min guard assist for toilet transfers without RW and reaches for furniture throughout mobility in room. Feel she would be more stable for functional mobility to bathroom with RW and pt is in agreement. Pt able to complete tub transfer with 3-in-1 with min guard assist this session and verbal cues for safe technique. She required min assist to don brace and supervision for LB dressing tasks with VC's to adhere to back precautions. Educated pt concerning compensatory strategies for LB dressing and bathing, avoiding twisting during toilet hygiene and utilizing wet-wipes, as well as 2 cup method for oral care at sink. Additionally instructed pt on log roll technique for bed mobility and pt requiring supervision for technique and increased education concerning importance of this for protecting her spine. All education complete and pt verbalizes and demonstrates understanding of all topics. Pt will have assistance at home from family intermittently. No further acute OT needs identified. OT will sign off.     Follow Up Recommendations  No OT follow up;Supervision - Intermittent    Equipment Recommendations  3 in 1 bedside commode (RW- 2 wheeled)    Recommendations for Other Services       Precautions / Restrictions Precautions Precautions: Back Precaution Booklet Issued: Yes (comment) Precaution Comments: Reviewed handout and back precautions during ADL.  Required Braces or Orthoses: Spinal Brace Spinal Brace: Lumbar  corset;Applied in standing position Restrictions Weight Bearing Restrictions: No      Mobility Bed Mobility Overal bed mobility: Needs Assistance Bed Mobility: Rolling;Sidelying to Sit;Sit to Sidelying Rolling: Supervision Sidelying to sit: Supervision     Sit to sidelying: Supervision General bed mobility comments: Pt has a high bed and demonstrated method to scoot back without twisting. Supervision with verbal cues for technique. Most difficulty with rolling with spine straight. Verbalizes understanding of technique following practice.   Transfers Overall transfer level: Needs assistance Equipment used: None Transfers: Sit to/from Stand Sit to Stand: Min guard         General transfer comment: Min guard assist for safety.     Balance Overall balance assessment: Needs assistance Sitting-balance support: No upper extremity supported;Feet supported Sitting balance-Leahy Scale: Good     Standing balance support: During functional activity;No upper extremity supported;Single extremity supported Standing balance-Leahy Scale: Fair Standing balance comment: Able to statically stand without UE support during grooming tasks.                            ADL either performed or assessed with clinical judgement   ADL Overall ADL's : Needs assistance/impaired Eating/Feeding: Set up;Sitting   Grooming: Supervision/safety;Standing (cueing for back precautions)   Upper Body Bathing: Supervision/ safety;Sitting (cueing for back precautions)   Lower Body Bathing: Supervison/ safety;Cueing for back precautions;Sit to/from stand   Upper Body Dressing : Minimal assistance;Standing;Sitting Upper Body Dressing Details (indicate cue type and reason): Min assist for brace. Supervision for donning shirt.  Lower Body Dressing: Supervision/safety;Sit to/from stand Lower Body Dressing Details (indicate cue type and reason): Initially attempting to complete in standing position but  unable  to do so without twisting/bending despite raising LE to knee. Educated pt on seated method and crossing LE over knee. Pt demonstrates understanding.  Toilet Transfer: Min guard;Ambulation;BSC   Toileting- Architect and Hygiene: Supervision/safety;Sit to/from stand;Cueing for back precautions   Tub/ Shower Transfer: Min guard;Ambulation;3 in 1   Functional mobility during ADLs: Min guard General ADL Comments: Pt educated concerning back precautions as related to ADL. Education provided concerning seated dressing and bathing techniques for LB, toilet hygiene techniques to use wet wipes and prevent twisting, use of 2 cups at sink for oral care, as well as safe tub transfers with 3-in-1 and log roll for bed mobility. Pt attempting to don underwear in standing and unable to complete while adhering to back precautions. After education completed, able to demonstrate understanding of reasoning behind technique.      Vision Patient Visual Report: No change from baseline Vision Assessment?: No apparent visual deficits     Perception     Praxis      Pertinent Vitals/Pain Pain Assessment: 0-10 Pain Score: 6  Pain Location: back  Pain Descriptors / Indicators: Aching;Sore;Operative site guarding Pain Intervention(s): Limited activity within patient's tolerance;Monitored during session;Repositioned     Hand Dominance     Extremity/Trunk Assessment Upper Extremity Assessment Upper Extremity Assessment: Overall WFL for tasks assessed   Lower Extremity Assessment Lower Extremity Assessment: Generalized weakness   Cervical / Trunk Assessment Cervical / Trunk Assessment: Other exceptions Cervical / Trunk Exceptions: s/p surgery    Communication Communication Communication: No difficulties   Cognition Arousal/Alertness: Awake/alert Behavior During Therapy: WFL for tasks assessed/performed Overall Cognitive Status: Within Functional Limits for tasks assessed                                      General Comments       Exercises     Shoulder Instructions      Home Living Family/patient expects to be discharged to:: Private residence Living Arrangements: Spouse/significant other;Children Available Help at Discharge: Family;Available PRN/intermittently Type of Home: House Home Access: Stairs to enter [employer] of Steps: 1 (threshold step) Entrance Stairs-Rails: None Home Layout: Two level;Able to live on main level with bedroom/bathroom Alternate Level Stairs-Number of Steps: 14   Bathroom Shower/Tub: Chief Strategy Officer: Standard     Home Equipment: Cane - single point          Prior Functioning/Environment Level of Independence: Needs assistance  Gait / Transfers Assistance Needed: Had cane at home for ambulation ADL's / Homemaking Assistance Needed: Required assist from husband for bathing secondary to pain.             OT Problem List: Decreased strength;Decreased activity tolerance;Impaired balance (sitting and/or standing);Decreased safety awareness;Decreased knowledge of use of DME or AE;Decreased knowledge of precautions;Pain      OT Treatment/Interventions:      OT Goals(Current goals can be found in the care plan section) Acute Rehab OT Goals Patient Stated Goal: to be independent and go home OT Goal Formulation: With patient Time For Goal Achievement: 11/18/16 Potential to Achieve Goals: Good  OT Frequency:     Barriers to D/C:            Co-evaluation              AM-PAC PT "6 Clicks" Daily Activity     Outcome Measure Help from another person eating meals?: None Help  from another person taking care of personal grooming?: A Little Help from another person toileting, which includes using toliet, bedpan, or urinal?: A Little Help from another person bathing (including washing, rinsing, drying)?: A Little Help from another person to put on and taking off regular upper  body clothing?: A Little Help from another person to put on and taking off regular lower body clothing?: A Little 6 Click Score: 19   End of Session Equipment Utilized During Treatment: Back brace Nurse Communication: Mobility status  Activity Tolerance: Patient tolerated treatment well Patient left: Other (comment) (ambulating with PT in hallway)  OT Visit Diagnosis: Other abnormalities of gait and mobility (R26.89);Pain Pain - Right/Left:  (back) Pain - part of body:  (back)                Time: 1610-9604 OT Time Calculation (min): 25 min Charges:  OT General Charges $OT Visit: 1 Procedure OT Evaluation $OT Eval Moderate Complexity: 1 Procedure OT Treatments $Self Care/Home Management : 8-22 mins G-Codes:     Doristine Section, MS OTR/L  Pager: [phone]   Aleria Maheu A Valerye Kobus 11/04/2016, 9:17 AM

## 2016-11-04 NOTE — Discharge Summary (Signed)
Physician Discharge Summary  Patient ID: Robin MartinezSarah N Walton MRN: [id number] DOB/AGE: 08-29-1985 30 y.o.  Admit date: 11/03/2016 Discharge date: 11/04/2016  Admission Diagnoses: post- lami spondylolisthesis    Discharge Diagnoses: same   Discharged Condition: good  Hospital Course: The patient was admitted on 11/03/2016 and taken to the operating room where the patient underwent lumbar fusion L4-5. The patient tolerated the procedure well and was taken to the recovery room and then to the floor in stable condition. The hospital course was routine. There were no complications. The wound remained clean dry and intact. Pt had appropriate back soreness. No complaints of leg pain or new N/T/W. The patient remained afebrile with stable vital signs, and tolerated a regular diet. The patient continued to increase activities, and pain was well controlled with oral pain medications.   Consults: None  Significant Diagnostic Studies:  Results for orders placed or performed during the hospital encounter of 10/26/16  Surgical pcr screen  Result Value Ref Range   MRSA, PCR NEGATIVE NEGATIVE   Staphylococcus aureus POSITIVE (A) NEGATIVE  Basic metabolic panel  Result Value Ref Range   Sodium 139 135 - 145 mmol/L   Potassium 3.5 3.5 - 5.1 mmol/L   Chloride 104 101 - 111 mmol/L   CO2 27 22 - 32 mmol/L   Glucose, Bld 76 65 - 99 mg/dL   BUN 8 6 - 20 mg/dL   Creatinine, Ser 1.190.82 0.44 - 1.00 mg/dL   Calcium 8.9 8.9 - 14.710.3 mg/dL   GFR calc non Af Amer >60 >60 mL/min   GFR calc Af Amer >60 >60 mL/min   Anion gap 8 5 - 15  CBC WITH DIFFERENTIAL  Result Value Ref Range   WBC 8.4 4.0 - 10.5 K/uL   RBC 4.80 3.87 - 5.11 MIL/uL   Hemoglobin 13.0 12.0 - 15.0 g/dL   HCT 82.940.6 56.236.0 - 13.046.0 %   MCV 84.6 78.0 - 100.0 fL   MCH 27.1 26.0 - 34.0 pg   MCHC 32.0 30.0 - 36.0 g/dL   RDW 86.513.5 78.411.5 - 69.615.5 %   Platelets 276 150 - 400 K/uL   Neutrophils Relative % 62 %   Neutro Abs 5.3 1.7 - 7.7 K/uL   Lymphocytes  Relative 24 %   Lymphs Abs 2.0 0.7 - 4.0 K/uL   Monocytes Relative 7 %   Monocytes Absolute 0.6 0.1 - 1.0 K/uL   Eosinophils Relative 6 %   Eosinophils Absolute 0.5 0.0 - 0.7 K/uL   Basophils Relative 1 %   Basophils Absolute 0.0 0.0 - 0.1 K/uL  Protime-INR  Result Value Ref Range   Prothrombin Time 13.8 11.4 - 15.2 seconds   INR 1.05   hCG, serum, qualitative  Result Value Ref Range   Preg, Serum NEGATIVE NEGATIVE  Type and screen [facility]  Result Value Ref Range   ABO/RH(D) B POS    Antibody Screen NEG    Sample Expiration 11/09/2016    Extend sample reason NO TRANSFUSIONS OR PREGNANCY IN THE PAST 3 MONTHS     Chest 2 View  Result Date: 10/26/2016 CLINICAL DATA:  Spondylolisthesis, preop evaluation for lumbar fusion EXAM: CHEST  2 VIEW COMPARISON:  08/22/2015 FINDINGS: The heart size and mediastinal contours are within normal limits. Both lungs are clear. The visualized skeletal structures are unremarkable. Thoracic spinal stimulator noted. Trachea is midline. No acute osseous finding. Normal bowel gas pattern. Remote cholecystectomy. IMPRESSION: No active cardiopulmonary disease. Electronically Signed   By: Judie PetitM.  Shick M.D.   On: 10/26/2016 10:03   Dg Lumbar Spine 2-3 Views  Result Date: 11/03/2016 CLINICAL DATA:  L4-5 fusion EXAM: DG C-ARM 61-120 MIN; LUMBAR SPINE - 2-3 VIEW COMPARISON:  CT 08/04/2016 FINDINGS: Placement of pedicle screws posteriorly at L4 and L5. No visible hardware or bony complicating feature. IMPRESSION: Posterior pedicle screw placement L4 and L5. Electronically Signed   By: Charlett Nose M.D.   On: 11/03/2016 09:41   Dg C-arm 1-60 Min  Result Date: 11/03/2016 CLINICAL DATA:  L4-5 fusion EXAM: DG C-ARM 61-120 MIN; LUMBAR SPINE - 2-3 VIEW COMPARISON:  CT 08/04/2016 FINDINGS: Placement of pedicle screws posteriorly at L4 and L5. No visible hardware or bony complicating feature. IMPRESSION: Posterior pedicle screw placement L4 and L5.  Electronically Signed   By: Charlett Nose M.D.   On: 11/03/2016 09:41    Antibiotics:  Anti-infectives    Start     Dose/Rate Route Frequency Ordered Stop   11/03/16 1900  vancomycin (VANCOCIN) IVPB 1000 mg/200 mL premix     1,000 mg 200 mL/hr over 60 Minutes Intravenous  Once 11/03/16 1305 11/03/16 2037   11/03/16 0925  vancomycin (VANCOCIN) powder  Status:  Discontinued       As needed 11/03/16 0926 11/03/16 1008   11/03/16 0832  bacitracin 50,000 Units in sodium chloride irrigation 0.9 % 500 mL irrigation  Status:  Discontinued       As needed 11/03/16 0834 11/03/16 1008   11/03/16 0542  vancomycin (VANCOCIN) IVPB 1000 mg/200 mL premix     1,000 mg 200 mL/hr over 60 Minutes Intravenous On call to O.R. 11/03/16 0542 11/03/16 0825      Discharge Exam: Blood pressure (!) 101/55, pulse 69, temperature 98.5 F (36.9 C), temperature source Oral, resp. rate 18, SpO2 95 %. Neurologic: Grossly normal Incision OK  Discharge Medications:   Allergies as of 11/04/2016      Reactions   Penicillins Anaphylaxis   PATIENT HAD A PCN REACTION WITH IMMEDIATE RASH, FACIAL/TONGUE/THROAT SWELLING, SOB, OR LIGHTHEADEDNESS WITH HYPOTENSION:  #  #  #  YES  #  #  #  Has patient had a PCN reaction causing severe rash involving mucus membranes or skin necrosis: No Has patient had a PCN reaction that required hospitalization No Has patient had a PCN reaction occurring within the last 10 years: No If all of the above answers are "NO", then may proceed with Cephalosporin use.   Hydrocodone Nausea And Vomiting   Morphine And Related Itching, Nausea And Vomiting   Oxycodone Itching, Nausea And Vomiting   Tape Rash   Reaction to adhesive on patch used after breast surgery - tegaderm      Medication List    TAKE these medications   cephALEXin 500 MG capsule Commonly known as:  KEFLEX Take 1 capsule (500 mg total) by mouth 2 (two) times daily.   HYDROmorphone 4 MG tablet Commonly known as:   DILAUDID Take 1 tablet (4 mg total) by mouth every 4 (four) hours as needed for moderate pain or severe pain. What changed:  when to take this   ibuprofen 200 MG tablet Commonly known as:  ADVIL,MOTRIN Take 200-400 mg by mouth daily as needed for headache or moderate pain.            Durable Medical Equipment        Start     Ordered   11/03/16 1228  DME Walker rolling  Once    Question:  Patient  needs a walker to treat with the following condition  Answer:  S/P lumbar fusion   11/03/16 1227   11/03/16 1228  DME 3 n 1  Once     11/03/16 1227      Disposition: home   Final Dx: lumbar fusion L4-5  Discharge Instructions    Call MD for:  difficulty breathing, headache or visual disturbances    Complete by:  As directed    Call MD for:  persistant nausea and vomiting    Complete by:  As directed    Call MD for:  redness, tenderness, or signs of infection (pain, swelling, redness, odor or green/yellow discharge around incision site)    Complete by:  As directed    Call MD for:  severe uncontrolled pain    Complete by:  As directed    Call MD for:  temperature >100.4    Complete by:  As directed    Diet - low sodium heart healthy    Complete by:  As directed    Increase activity slowly    Complete by:  As directed       Follow-up Information    Tia Alert, MD. Schedule an appointment as soon as possible for a visit in 2 week(s).   Specialty:  Neurosurgery Contact information: [address] [place] [address] [phone]            Signed: Tia Alert 11/04/2016, 7:43 AM

## 2016-11-04 NOTE — Progress Notes (Signed)
Patient alert and oriented, mae's well, voiding adequate amount of urine, swallowing without difficulty, c/o pain at time of discharge, and has pain medication. Patient discharged home with family. Script and discharged instructions given to patient. Patient and family stated understanding of instructions given. Patient has an appointment with Dr. Yetta BarreJones

## 2016-11-04 NOTE — Progress Notes (Signed)
Physical Therapy Treatment Patient Details Name: Robin MartinezSarah N Walton MRN: [id number] DOB: [birth year] Today's Date: 11/04/2016    History of Present Illness Pt is s/p L4-5 intertransverse arthrodesis and posterior fixation. PMH includes spinal cord stimulator insertion and previous back surgery.     PT Comments    Pt progressing towards physical therapy goals. Was able to perform transfers and ambulation with min guard to min assist for balance support and safety.  Pt continues to move slowly throughout session but reports increased comfort and confidence with RW use. Pt was educated on car transfer, walking program, brace wearing schedule/management, and general safety with precautions. Will continue to follow.  Follow Up Recommendations  No PT follow up;Supervision - Intermittent     Equipment Recommendations  Rolling walker with 5" wheels;3in1 (PT) ([employer] size)    Recommendations for Other Services       Precautions / Restrictions Precautions Precautions: Back Precaution Booklet Issued: Yes (comment) Precaution Comments: Reviewed handout and back precautions during ADL.  Required Braces or Orthoses: Spinal Brace Spinal Brace: Lumbar corset;Applied in standing position Restrictions Weight Bearing Restrictions: No    Mobility  Bed Mobility Overal bed mobility: Needs Assistance     General bed mobility comments: Pt received standing with OT. Verbally discussed log roll and negotiating a higher bed height.   Transfers Overall transfer level: Needs assistance Equipment used: None Transfers: Sit to/from Stand Sit to Stand: Min guard         General transfer comment: Close guard for safety awareness.   Ambulation/Gait Ambulation/Gait assistance: Min assist Ambulation Distance (Feet): 75 Feet Assistive device: Rolling walker (2 wheeled) Gait Pattern/deviations: Step-through pattern;Decreased stride length;Antalgic Gait velocity: Decreased Gait velocity interpretation:  Below normal speed for age/gender General Gait Details: RW used for stability (youth size due to height). Pt reports feeling more comfortable with BUE support. Overall very slow to progress over distance.    Stairs Stairs: Yes   Stair Management: One rail Right;Step to pattern;Forwards Number of Stairs: 2 (1 step x2) General stair comments: Therapist's hand on L for min assist. VC's for sequencing and safety.   Wheelchair Mobility    Modified Rankin (Stroke Patients Only)       Balance Overall balance assessment: Needs assistance Sitting-balance support: No upper extremity supported;Feet supported Sitting balance-Leahy Scale: Good     Standing balance support: During functional activity;No upper extremity supported;Single extremity supported Standing balance-Leahy Scale: Fair Standing balance comment: Able to statically stand without UE support                            Cognition Arousal/Alertness: Awake/alert Behavior During Therapy: WFL for tasks assessed/performed Overall Cognitive Status: Within Functional Limits for tasks assessed                                        Exercises      General Comments        Pertinent Vitals/Pain Pain Assessment: Faces Pain Score: 6  Faces Pain Scale: Hurts even more Pain Location: back  Pain Descriptors / Indicators: Aching;Sore;Operative site guarding Pain Intervention(s): Limited activity within patient's tolerance;Monitored during session;Repositioned    Home Living Family/patient expects to be discharged to:: Private residence Living Arrangements: Spouse/significant other;Children Available Help at Discharge: Family;Available PRN/intermittently Type of Home: House Home Access: Stairs to enter Entrance Stairs-Rails: None Home Layout: Two level;Able to  live on main level with bedroom/bathroom Home Equipment: [employer] - single point      Prior Function Level of Independence: Needs assistance   Gait / Transfers Assistance Needed: Had cane at home for ambulation ADL's / Homemaking Assistance Needed: Required assist from husband for bathing secondary to pain.      PT Goals (current goals can now be found in the care plan section) Acute Rehab PT Goals Patient Stated Goal: to be independent and go home PT Goal Formulation: With patient/family Time For Goal Achievement: 11/10/16 Potential to Achieve Goals: Good Progress towards PT goals: Progressing toward goals    Frequency    Min 5X/week      PT Plan Current plan remains appropriate    Co-evaluation              AM-PAC PT "6 Clicks" Daily Activity  Outcome Measure  Difficulty turning over in bed (including adjusting bedclothes, sheets and blankets)?: A Little Difficulty moving from lying on back to sitting on the side of the bed? : A Little Difficulty sitting down on and standing up from a chair with arms (e.g., wheelchair, bedside commode, etc,.)?: A Little Help needed moving to and from a bed to chair (including a wheelchair)?: A Little Help needed walking in hospital room?: A Little Help needed climbing 3-5 steps with a railing? : A Little 6 Click Score: 18    End of Session Equipment Utilized During Treatment: Gait belt;Back brace Activity Tolerance: Patient limited by pain Patient left: in bed;with call bell/phone within reach;with family/visitor present Nurse Communication: Mobility status PT Visit Diagnosis: Other abnormalities of gait and mobility (R26.89);Pain Pain - part of body:  (back )     Time: 1610-9604 PT Time Calculation (min) (ACUTE ONLY): 26 min  Charges:  $Gait Training: 23-37 mins                    G Codes:       Conni Slipper, PT, DPT Acute Rehabilitation Services Pager: [phone]    [name] 11/04/2016, 11:36 AM

## 2017-10-10 ENCOUNTER — Emergency Department (HOSPITAL_COMMUNITY)
Admission: EM | Admit: 2017-10-10 | Discharge: 2017-10-10 | Disposition: A | Discharge status: Home / Self Care | Payer: BLUE CROSS/BLUE SHIELD | Attending: Emergency Medicine | Admitting: Emergency Medicine

## 2017-10-10 ENCOUNTER — Other Ambulatory Visit: Payer: Self-pay

## 2017-10-10 DIAGNOSIS — M5441 Lumbago with sciatica, right side: Secondary | ICD-10-CM | POA: Insufficient documentation

## 2017-10-10 DIAGNOSIS — M5442 Lumbago with sciatica, left side: Secondary | ICD-10-CM | POA: Insufficient documentation

## 2017-10-10 DIAGNOSIS — F17210 Nicotine dependence, cigarettes, uncomplicated: Secondary | ICD-10-CM | POA: Insufficient documentation

## 2017-10-10 DIAGNOSIS — F419 Anxiety disorder, unspecified: Secondary | ICD-10-CM

## 2017-10-10 DIAGNOSIS — R200 Anesthesia of skin: Secondary | ICD-10-CM | POA: Insufficient documentation

## 2017-10-10 DIAGNOSIS — G8929 Other chronic pain: Secondary | ICD-10-CM | POA: Insufficient documentation

## 2017-10-10 MED ORDER — CYCLOBENZAPRINE HCL 10 MG PO TABS: 10.0000 mg | ORAL_TABLET | Freq: Two times a day (BID) | ORAL | 0 refills | Status: AC | PRN

## 2017-10-10 MED ORDER — HYDROXYZINE HCL 25 MG PO TABS: 25.0000 mg | ORAL_TABLET | Freq: Four times a day (QID) | ORAL | 0 refills | Status: DC

## 2017-10-10 MED ORDER — HYDROXYZINE HCL 25 MG PO TABS: 25.0000 mg | ORAL_TABLET | Freq: Four times a day (QID) | ORAL | 0 refills | Status: AC | PRN

## 2017-10-10 MED ORDER — KETOROLAC TROMETHAMINE 30 MG/ML IJ SOLN
30.0000 mg | Freq: Once | INTRAMUSCULAR | Status: AC
  Administered 2017-10-10: 30 mg via INTRAMUSCULAR
  Filled 2017-10-10: qty 1

## 2017-10-10 NOTE — ED Notes (Signed)
ED Provider at bedside. 

## 2017-10-10 NOTE — Discharge Instructions (Signed)
Continue taking your home medications.  You may start taking your steroid pack beginning tomorrow.  You may take Flexeril as needed for muscle spasm.  Do not drive, drink alcohol, or operate heavy machinery on this medicine as it may make you drowsy.  Apply ice or heat for comfort.  Follow-up with your primary care physician or pain management physician for reevaluation of your symptoms.  Return to the emergency department if any concerning signs or symptoms develop.

## 2017-10-10 NOTE — ED Triage Notes (Signed)
Pt to Ed via [facility] with back pain. Hx of back surgery, spinal cord stimulator implant. Overexerted herself today and having severe pain x1 hour. Pt had panic attack after pain onset causing sob/cp. 122/97, HR 113 ST, 92% room air. Pt upset and tearful on arrival

## 2017-10-10 NOTE — ED Provider Notes (Signed)
[facility] EMERGENCY DEPARTMENT Provider Note   CSN: [id number] Arrival date & time: 10/10/17  2031     History   Chief Complaint Chief Complaint  Patient presents with  . Back Pain    HPI [name] is a 32 y.o. female with history of chronic back pain status post L4/5 lumbar fusion and spinal cord stimulator presents for evaluation of acute onset, resolved episode of panic as well as worsening back pain.  She states that she has very limited activities at baseline and typically only bathes once a week as a result.  Attempted to cook dinner she states that she bathes today, shaved her legs, and.  She states that this is a departure from her normal activity levels and her chronic pain worsened as a result.  Pain is constant extends from the lumbar spine to the bilateral hips and radiates down the bilateral lower extremities.  She notes numbness to the lateral aspect of her left lower extremity which is constant, chronic, and unchanged.  She states that her back pain has been worsening over the past few weeks and she called her  pain management physician who stated that she could wait until her appointment next month.  She is on 4 mg of Dilaudid every 4 hours at baseline.  She tells me earlier today as her pain worsened she began having a panic attack.  She states that she has had these in the past and they have been increasing in frequency but she does not take any medications for them.  She states that it was characteristic of her usual panic attacks but lasted somewhat longer.  She denies syncope.  She denies fevers, IV drug use, saddle anesthesia, bowel or bladder incontinence, or history of cancer.  She embolus with a cane and walker at baseline.  The history is provided by the patient.    Past Medical History:  Diagnosis Date  . Abscess    right breast  . Anemia    as a child  . Family history of adverse reaction to anesthesia    mother had heart bypass  surgery and hard time to wake her up  . Kidney infection 7/15  . Lung collapse 7/15   both lungs collapse, no symptoms,,spontanous([facility])  . PCOS (polycystic ovarian syndrome)     Patient Active Problem List   Diagnosis Date Noted  . S/P lumbar spinal fusion 11/03/2016  . Wound drainage 05/10/2014    Past Surgical History:  Procedure Laterality Date  . BACK SURGERY    . BREAST SURGERY Right 9/16   lumpectomy  . CHOLECYSTECTOMY    . SPINAL CORD STIMULATOR INSERTION N/A 08/22/2015   Procedure: LUMBAR SPINAL CORD STIMULATOR INSERTION;  Surgeon: Odette Fraction, MD;  Location: [facility];  Service: Neurosurgery;  Laterality: N/A;     OB History   None      Home Medications    Prior to Admission medications   Medication Sig Start Date End Date Taking? Authorizing Provider  cephALEXin (KEFLEX) 500 MG capsule Take 1 capsule (500 mg total) by mouth 2 (two) times daily. Patient not taking: Reported on 10/21/2016 09/15/16   Loleta Rose, MD  cyclobenzaprine (FLEXERIL) 10 MG tablet Take 1 tablet (10 mg total) by mouth 2 (two) times daily as needed for muscle spasms. 10/10/17   Rowan Blaker A, PA-C  HYDROmorphone (DILAUDID) 4 MG tablet Take 1 tablet (4 mg total) by mouth every 4 (four) hours as needed for  moderate pain or severe pain. Patient taking differently: Take 4 mg by mouth 2 (two) times daily as needed for moderate pain or severe pain.  08/22/15   Odette Fraction, MD  hydrOXYzine (ATARAX/VISTARIL) 25 MG tablet Take 1 tablet (25 mg total) by mouth every 6 (six) hours as needed for anxiety. 10/10/17   Shneur Whittenburg A, PA-C  ibuprofen (ADVIL,MOTRIN) 200 MG tablet Take 200-400 mg by mouth daily as needed for headache or moderate pain.    [provider]    Family History Family History  Problem Relation Age of Onset  . Diabetes Mother   . Heart disease Mother   . COPD Mother   . Cancer Brother   . Bipolar disorder Brother   . Schizophrenia Brother   .  Alzheimer's disease Other   . Dementia Other     Social History Social History   Tobacco Use  . Smoking status: Light Tobacco Smoker    Packs/day: 0.25    Years: 15.00    Pack years: 3.75    Types: Cigarettes  . Smokeless tobacco: Never Used  Substance Use Topics  . Alcohol use: No  . Drug use: No     Allergies   Penicillins; Hydrocodone; Morphine and related; Oxycodone; and Tape   Review of Systems Review of Systems  Constitutional: Negative for chills and fever.  Respiratory: Negative for shortness of breath.   Cardiovascular: Negative for chest pain.  Gastrointestinal: Negative for abdominal pain, nausea and vomiting.  Genitourinary: Negative for difficulty urinating.  Musculoskeletal: Positive for back pain.  Neurological: Positive for weakness and numbness. Negative for light-headedness and headaches.  All other systems reviewed and are negative.    Physical Exam Updated Vital Signs BP 118/86 (BP Location: Right Arm)   Pulse 94   Temp 98.1 F (36.7 C) (Oral)   Resp 14   Ht  (1.448 m)   Wt 81.6 kg (180 lb)   SpO2 98%   BMI 38.95 kg/m   Physical Exam  Constitutional: She appears well-developed and well-nourished. No distress.  Resting in bed  HENT:  Head: Normocephalic and atraumatic.  Eyes: Conjunctivae are normal. Right eye exhibits no discharge. Left eye exhibits no discharge.  Neck: Normal range of motion. No JVD present. No tracheal deviation present.  Cardiovascular: Normal rate, regular rhythm, normal heart sounds and intact distal pulses. Exam reveals no gallop.  Pulmonary/Chest: Effort normal and breath sounds normal. She has no rales.  Abdominal: Soft. Bowel sounds are normal. She exhibits no distension. There is no tenderness.  Musculoskeletal: She exhibits no edema or deformity.  No midline spine tenderness, no paraspinal muscle tenderness.  Well-healed midline surgical incision noted to the low back.  Spinal cord stimulator is in place  with no erythema or induration.  Decreased (4/5) strength of left hip flexor which patient states is chronic and unchanged.  Otherwise 5/5 strength of BLE major muscle groups.  Neurological: She is alert. A sensory deficit is present.  Fluent speech, no facial droop, decreased sensation to the lateral aspect of the left lower extremity from the knee down.  Patient states this is chronic and unchanged.  Otherwise sensation is intact to soft touch of extreme knees.  Ambulates with an antalgic gait with the aid of a cane.  Good balance.  Skin: Skin is warm and dry. No erythema.  Psychiatric: Her behavior is normal. Her mood appears anxious. She expresses no homicidal and no suicidal ideation. She expresses no suicidal plans and no homicidal  plans.  Nursing note and vitals reviewed.    ED Treatments / Results  Labs (all labs ordered are listed, but only abnormal results are displayed) Labs Reviewed - No data to display  EKG None  Radiology No results found.  Procedures Procedures (including critical care time)  Medications Ordered in ED Medications  ketorolac (TORADOL) 30 MG/ML injection 30 mg (30 mg Intramuscular Given 10/10/17 2136)     Initial Impression / Assessment and Plan / ED Course  I have reviewed the triage vital signs and the nursing notes.  Pertinent labs & imaging results that were available during my care of the patient were reviewed by me and considered in my medical decision making (see chart for details).     Patient with history of chronic back pain presents for worsening back pain and anxiety.  She is afebrile, vital signs are stable.  She is mildly anxious in appearance but nontoxic.  Dr. Ollen Bowl is her pain management physician.  She is ambulatory without difficulty despite pain.  Her neurologic exam is at her baseline.  No red flag signs concerning for cauda equina or spinal abscess.  Offered her multiple nonnarcotic pain management options and she states she  would like a Toradol injection.  She has a steroid taper at home which she states that she will take beginning tomorrow.  We will offer her Flexeril for muscle relaxation.  Advised of appropriate use of these medications.  She will call her pain management physician for reevaluation of her symptoms and to move up her follow-up appointment.  Discussed conservative management of her pain additionally with heat therapy, aquatic therapy, and physical therapy.  Will discharge with small amount of hydroxyzine to try the next time that she has a panic attack.  She denies suicidal ideation or homicidal ideation.  I encouraged the patient to follow-up with a counselor/psychiatrist for her symptoms.  Discussed strict ED return precautions.  Patient and patient's husband verbalized understanding of and agreement with plan and patient is stable for discharge home at this time.  Final Clinical Impressions(s) / ED Diagnoses   Final diagnoses:  Chronic bilateral low back pain with bilateral sciatica  Anxiety    ED Discharge Orders        Ordered    hydrOXYzine (ATARAX/VISTARIL) 25 MG tablet  Every 6 hours,   Status:  Discontinued     10/10/17 2130    cyclobenzaprine (FLEXERIL) 10 MG tablet  2 times daily PRN     10/10/17 2130    hydrOXYzine (ATARAX/VISTARIL) 25 MG tablet  Every 6 hours PRN     10/10/17 2131       Jeanie Sewer, PA-C 10/10/17 2225    Raeford Razor, MD 10/11/17 1539

## 2017-10-21 ENCOUNTER — Other Ambulatory Visit: Payer: Self-pay | Admitting: Neurosurgery

## 2017-10-21 DIAGNOSIS — M4316 Spondylolisthesis, lumbar region: Secondary | ICD-10-CM

## 2017-10-27 ENCOUNTER — Ambulatory Visit
Admission: RE | Admit: 2017-10-27 | Discharge: 2017-10-27 | Disposition: A | Discharge status: Home / Self Care | Payer: BLUE CROSS/BLUE SHIELD | Source: Ambulatory Visit | Attending: Neurosurgery | Admitting: Neurosurgery

## 2017-10-27 DIAGNOSIS — M4316 Spondylolisthesis, lumbar region: Secondary | ICD-10-CM

## 2017-10-27 MED ORDER — ONDANSETRON HCL 4 MG/2ML IJ SOLN: 4.0000 mg | Freq: Four times a day (QID) | INTRAMUSCULAR | Status: DC | PRN

## 2017-10-27 MED ORDER — DIAZEPAM 5 MG PO TABS
10.0000 mg | ORAL_TABLET | Freq: Once | ORAL | Status: AC
  Administered 2017-10-27: 10 mg via ORAL

## 2017-10-27 MED ORDER — HYDROXYZINE HCL 50 MG/ML IM SOLN
25.0000 mg | Freq: Once | INTRAMUSCULAR | Status: AC
Start: 2017-10-27 — End: 2017-10-27
  Administered 2017-10-27: 25 mg via INTRAMUSCULAR

## 2017-10-27 MED ORDER — HYDROMORPHONE HCL 1 MG/ML IJ SOLN
1.0000 mg | Freq: Once | INTRAMUSCULAR | Status: AC
  Administered 2017-10-27: 1 mg via INTRAMUSCULAR

## 2017-10-27 MED ORDER — IOPAMIDOL (ISOVUE-M 200) INJECTION 41%
15.0000 mL | Freq: Once | INTRAMUSCULAR | Status: AC
  Administered 2017-10-27: 15 mL via INTRATHECAL

## 2017-10-27 NOTE — Discharge Instructions (Signed)

## 2018-11-25 IMAGING — XA DG MYELOGRAPHY LUMBAR INJ LUMBOSACRAL
12 series · 12 of 12 positions shown · non-contrast
Comparison: Multiple previous imaging studies including previous
myelogram 08/04/2016

CLINICAL DATA: Persistent low back and hip pain left worse than
right post L4-5 fusion.
TECHNIQUE: Contiguous axial images were obtained through the Lumbar spine after
the intrathecal infusion of infusion. Coronal and sagittal
reconstructions were obtained of the axial image sets.

[Series 1: w lumbar spine lat · 0.15mm/px · 1 of 1 slices shown]
[im 1/1]
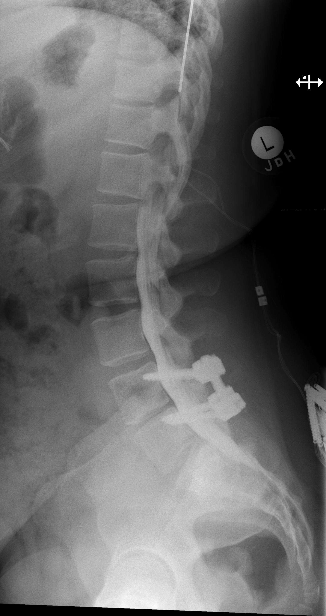

[Series 1: vasc standard · 1 of 1 slices shown (1 of 9)]
[im 1/1]
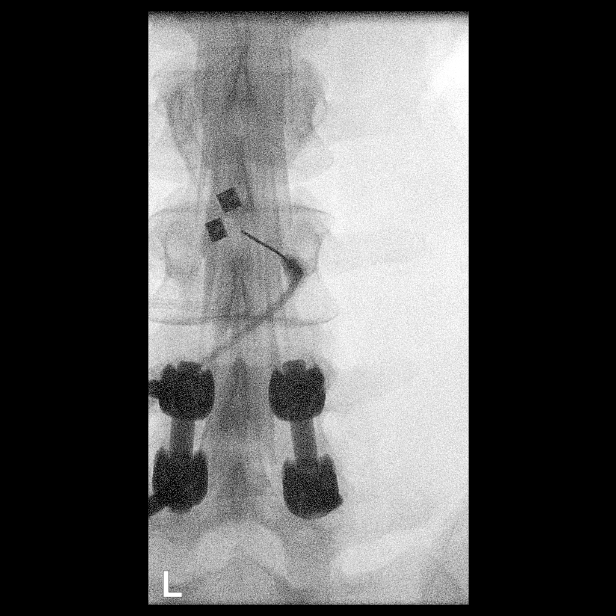

[Series 2: w lumbar spine flexion · 0.15mm/px · 1 of 1 slices shown]
[im 1/1]
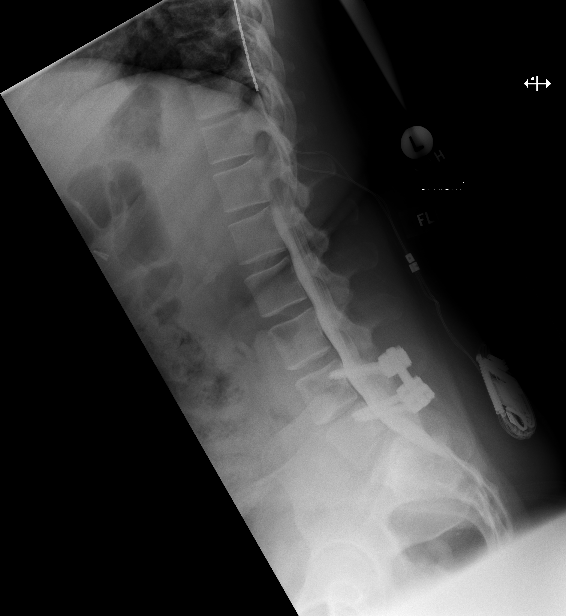

[Series 2: vasc standard · 1 of 1 slices shown (2 of 9)]
[im 1/1]
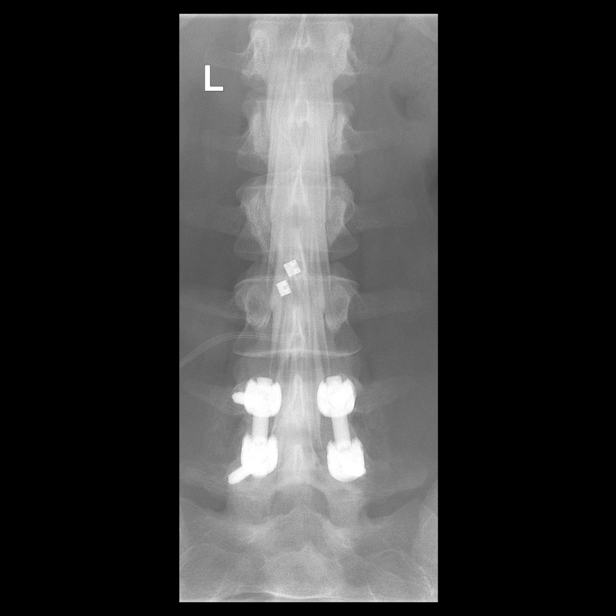

[Series 3: w lumbar spine extension · 0.15mm/px · 1 of 1 slices shown]
[im 1/1]
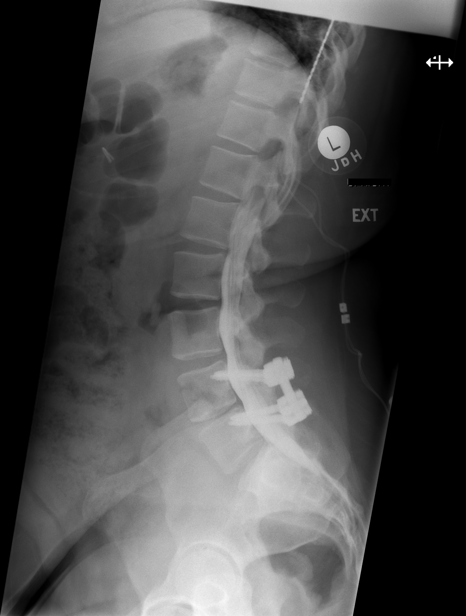

[Series 3: vasc standard · 1 of 1 slices shown (3 of 9)]
[im 1/1]
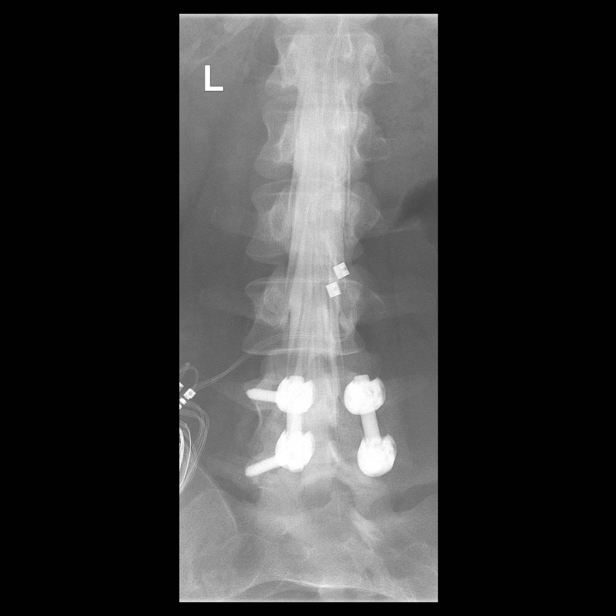

[Series 4: vasc standard · 1 of 1 slices shown (4 of 9)]
[im 1/1]
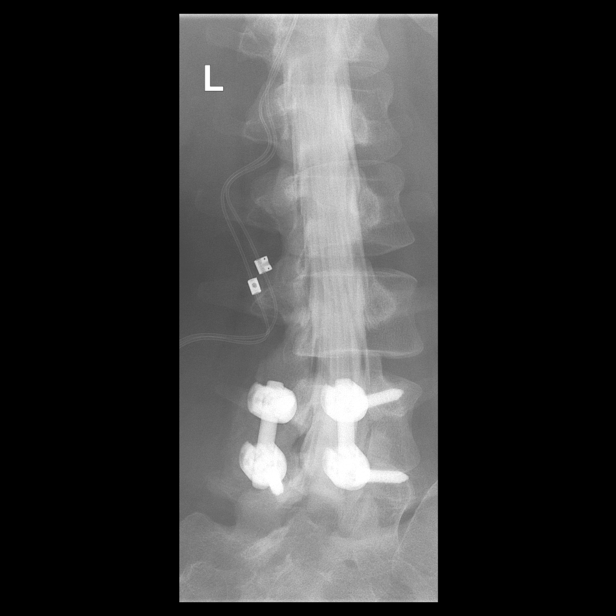

[Series 5: vasc standard · 1 of 1 slices shown (5 of 9)]
[im 1/1]
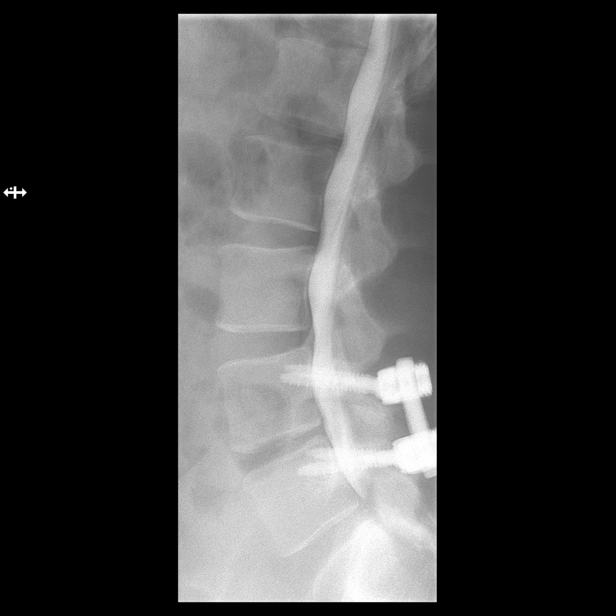

[Series 6: vasc standard · 1 of 1 slices shown (6 of 9)]
[im 1/1]
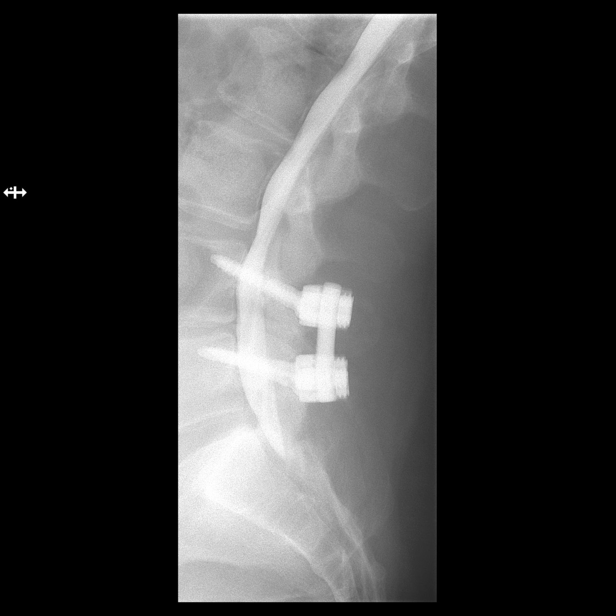

[Series 7: vasc standard · 1 of 1 slices shown (7 of 9)]
[im 1/1]
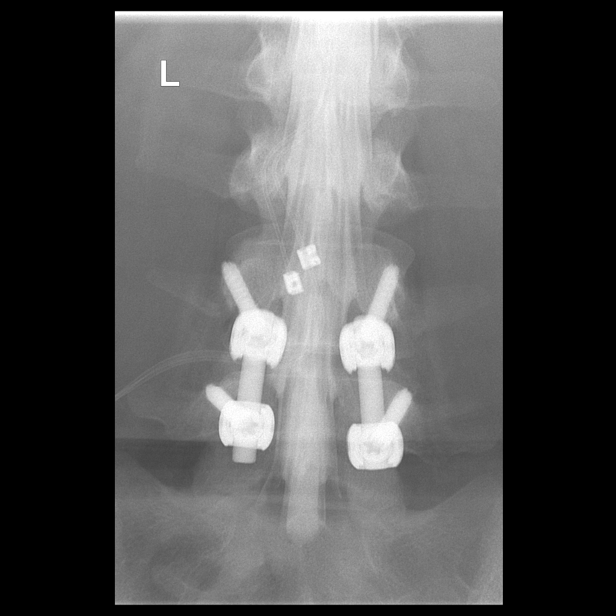

[Series 8: vasc standard · 1 of 1 slices shown (8 of 9)]
[im 1/1]
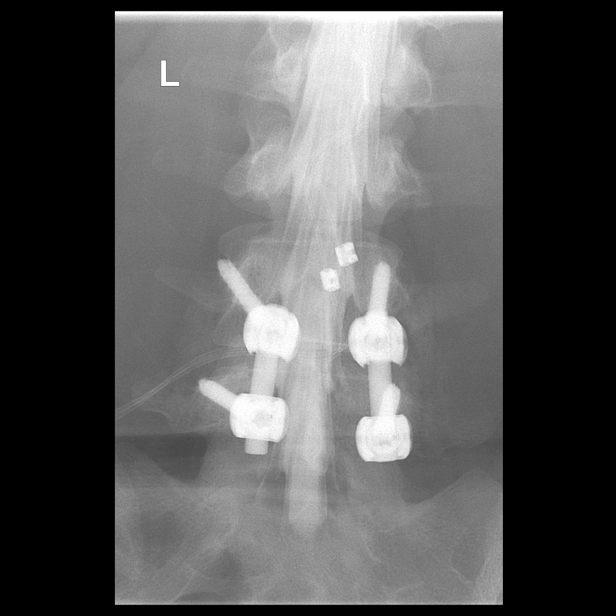

[Series 9: vasc standard · 1 of 1 slices shown (9 of 9)]
[im 1/1]
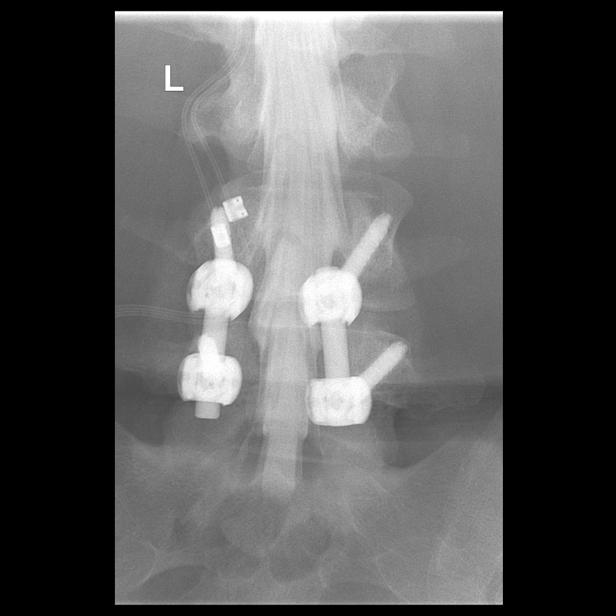

[12 of 12 positions shown; findings below may reference images not displayed]

EXAM:
LUMBAR MYELOGRAM

FLUOROSCOPY TIME:  0 minutes 45 seconds. 162.23 micro gray meter
squared

PROCEDURE:
After thorough discussion of risks and benefits of the procedure
including bleeding, infection, injury to nerves, blood vessels,
adjacent structures as well as headache and CSF leak, written and
oral informed consent was obtained. Consent was obtained by Dr. Kaki
Wuilmer. Time out form was completed.

Patient was positioned prone on the fluoroscopy table. Local
anesthesia was provided with 1% lidocaine without epinephrine after
prepped and draped in the usual sterile fashion. Puncture was
performed at L2-3 using a 3 1/2 inch 22-gauge spinal needle via
right para median approach. Using a single pass through the dura,
the needle was placed within the thecal sac, with return of clear
CSF. 15 mL of Isovue E-1TT was injected into the thecal sac, with
normal opacification of the nerve roots and cauda equina consistent
with free flow within the subarachnoid space.

I personally performed the lumbar puncture and administered the
intrathecal contrast. I also personally performed acquisition of the
myelogram images.
FINDINGS: LUMBAR MYELOGRAM FINDINGS:

Alignment appears normal. Small anterior extradural defect noted at
L 3-4. Slightly diminished filling of the right L5 root sleeve
compared to the left. No other abnormal root sleeve appearance.
Standing flexion extension views show slight increase in the
anterior extradural defect at L3-4. With flexion extension, no
subluxation occurs. No sign of loosening at L4-5.

CT LUMBAR MYELOGRAM FINDINGS:

No abnormality at L2-3 or above. The discs are normal. The canal and
foramina are widely patent. The distal cord and conus are normal
with the conus tip at L1.

L3-4: Minimal disc bulge. No stenosis or neural compression. Very
minimal facet hypertrophy.

L4-5: Previous posterior decompression and fusion procedure with
bilateral pedicle screws. Screw tip stoop breach the cortex on the
left at L4 and bilaterally at L5, but do not encounter any
significant structure. No sign of loosening or motion. There is
remaining disc material which shows degenerative desiccation with
vacuum phenomenon. Central canal is sufficiently decompressed. There
is mild narrowing of the subarticular lateral recess on the right
without distinct compression of the right L5 nerve.

L5-S1: Normal appearance of the disc. Very minimal facet
hypertrophy. No stenosis.

Sacroiliac joints show  degeneration with vacuum phenomenon.
IMPRESSION: L3-4: Minimal disc bulge and facet hypertrophy. No compressive
stenosis.

L4-5: No canal or foraminal stenosis. Disc degeneration with some
vacuum phenomenon. Mild narrowing of the subarticular lateral recess
on the right with slight thinning of the root sleeve of the L5
nerve, but no definite compression.

L5-S1: Very minimal facet hypertrophy.  No stenosis.

Bilateral sacroiliac arthropathy. Could this be the cause of the
patient's pain?

## 2018-11-30 ENCOUNTER — Other Ambulatory Visit: Payer: Self-pay

## 2018-11-30 ENCOUNTER — Encounter: Payer: Self-pay | Admitting: *Deleted

## 2018-11-30 ENCOUNTER — Ambulatory Visit (INDEPENDENT_AMBULATORY_CARE_PROVIDER_SITE_OTHER): Payer: Medicaid Other

## 2018-11-30 DIAGNOSIS — N912 Amenorrhea, unspecified: Secondary | ICD-10-CM

## 2018-11-30 DIAGNOSIS — Z3201 Encounter for pregnancy test, result positive: Secondary | ICD-10-CM

## 2018-11-30 LAB — POCT URINE PREGNANCY: Preg Test, Ur: POSITIVE — AB

## 2018-11-30 NOTE — Progress Notes (Signed)
[name] presents today for UPT. She has no unusual complaints. LMP:10/18/2018    OBJECTIVE: Appears well, in no apparent distress.  OB History   No obstetric history on file.    Home UPT Result:Positive  In-Office UPT result: Positive I have reviewed the patient's medical, obstetrical, social, and family histories, and medications.   ASSESSMENT: Positive pregnancy test  PLAN Prenatal care to be completed at: [facility]

## 2018-12-19 ENCOUNTER — Ambulatory Visit: Payer: Medicaid Other

## 2018-12-19 DIAGNOSIS — O0990 Supervision of high risk pregnancy, unspecified, unspecified trimester: Secondary | ICD-10-CM

## 2018-12-19 NOTE — Progress Notes (Signed)
Pt is on the phone for new OB interview. LMP 10/18/18, EDD 07/25/19.

## 2018-12-29 DIAGNOSIS — Z3687 Encounter for antenatal screening for uncertain dates: Secondary | ICD-10-CM | POA: Diagnosis not present

## 2018-12-29 DIAGNOSIS — Z3201 Encounter for pregnancy test, result positive: Secondary | ICD-10-CM | POA: Diagnosis not present

## 2018-12-29 DIAGNOSIS — O3680X0 Pregnancy with inconclusive fetal viability, not applicable or unspecified: Secondary | ICD-10-CM | POA: Diagnosis not present

## 2018-12-29 DIAGNOSIS — Z124 Encounter for screening for malignant neoplasm of cervix: Secondary | ICD-10-CM | POA: Diagnosis not present

## 2018-12-29 DIAGNOSIS — O219 Vomiting of pregnancy, unspecified: Secondary | ICD-10-CM | POA: Diagnosis not present

## 2018-12-29 DIAGNOSIS — Z3A10 10 weeks gestation of pregnancy: Secondary | ICD-10-CM | POA: Diagnosis not present

## 2018-12-29 DIAGNOSIS — Z3A01 Less than 8 weeks gestation of pregnancy: Secondary | ICD-10-CM | POA: Diagnosis not present

## 2018-12-29 DIAGNOSIS — Z98891 History of uterine scar from previous surgery: Secondary | ICD-10-CM | POA: Diagnosis not present

## 2019-01-01 DIAGNOSIS — O3680X0 Pregnancy with inconclusive fetal viability, not applicable or unspecified: Secondary | ICD-10-CM | POA: Diagnosis not present

## 2019-01-01 DIAGNOSIS — O021 Missed abortion: Secondary | ICD-10-CM | POA: Diagnosis not present

## 2019-01-01 DIAGNOSIS — Z3A10 10 weeks gestation of pregnancy: Secondary | ICD-10-CM | POA: Diagnosis not present

## 2019-01-02 DIAGNOSIS — Z01812 Encounter for preprocedural laboratory examination: Secondary | ICD-10-CM | POA: Diagnosis not present

## 2019-01-02 DIAGNOSIS — Z3A10 10 weeks gestation of pregnancy: Secondary | ICD-10-CM | POA: Diagnosis not present

## 2019-01-02 DIAGNOSIS — Z1159 Encounter for screening for other viral diseases: Secondary | ICD-10-CM | POA: Diagnosis not present

## 2019-01-04 ENCOUNTER — Encounter: Payer: Medicaid Other | Admitting: Obstetrics and Gynecology

## 2019-01-04 NOTE — Progress Notes (Signed)
Patient ID: [name], female   DOB: [birth year], 33 y.o.   MRN: [id number] Patient seen and assessed by nursing staff during this encounter. I have reviewed the chart and agree with the documentation and plan.  Emeterio Reeve, MD 01/04/2019 11:09 AM

## 2019-01-05 DIAGNOSIS — O021 Missed abortion: Secondary | ICD-10-CM | POA: Diagnosis not present

## 2019-01-05 DIAGNOSIS — Z79899 Other long term (current) drug therapy: Secondary | ICD-10-CM | POA: Diagnosis not present

## 2019-01-05 DIAGNOSIS — Z6837 Body mass index (BMI) 37.0-37.9, adult: Secondary | ICD-10-CM | POA: Diagnosis not present

## 2019-01-05 DIAGNOSIS — E669 Obesity, unspecified: Secondary | ICD-10-CM | POA: Diagnosis not present

## 2019-01-05 DIAGNOSIS — F17210 Nicotine dependence, cigarettes, uncomplicated: Secondary | ICD-10-CM | POA: Diagnosis not present

## 2019-01-05 DIAGNOSIS — Z9989 Dependence on other enabling machines and devices: Secondary | ICD-10-CM | POA: Diagnosis not present

## 2019-01-05 DIAGNOSIS — M160 Bilateral primary osteoarthritis of hip: Secondary | ICD-10-CM | POA: Diagnosis not present

## 2019-01-05 DIAGNOSIS — G4733 Obstructive sleep apnea (adult) (pediatric): Secondary | ICD-10-CM | POA: Diagnosis not present

## 2019-02-28 DIAGNOSIS — N938 Other specified abnormal uterine and vaginal bleeding: Secondary | ICD-10-CM | POA: Diagnosis not present

## 2019-02-28 DIAGNOSIS — F329 Major depressive disorder, single episode, unspecified: Secondary | ICD-10-CM | POA: Diagnosis not present

## 2019-02-28 DIAGNOSIS — E282 Polycystic ovarian syndrome: Secondary | ICD-10-CM | POA: Diagnosis not present

## 2019-03-19 DIAGNOSIS — F321 Major depressive disorder, single episode, moderate: Secondary | ICD-10-CM | POA: Diagnosis not present

## 2019-03-21 DIAGNOSIS — Z833 Family history of diabetes mellitus: Secondary | ICD-10-CM | POA: Diagnosis not present

## 2019-03-21 DIAGNOSIS — Z79899 Other long term (current) drug therapy: Secondary | ICD-10-CM | POA: Diagnosis not present

## 2019-03-21 DIAGNOSIS — F329 Major depressive disorder, single episode, unspecified: Secondary | ICD-10-CM | POA: Diagnosis not present

## 2019-03-21 DIAGNOSIS — F419 Anxiety disorder, unspecified: Secondary | ICD-10-CM | POA: Diagnosis not present

## 2019-03-21 DIAGNOSIS — N926 Irregular menstruation, unspecified: Secondary | ICD-10-CM | POA: Diagnosis not present

## 2019-03-21 DIAGNOSIS — Z23 Encounter for immunization: Secondary | ICD-10-CM | POA: Diagnosis not present

## 2019-03-21 DIAGNOSIS — Z309 Encounter for contraceptive management, unspecified: Secondary | ICD-10-CM | POA: Diagnosis not present

## 2019-04-04 DIAGNOSIS — Z309 Encounter for contraceptive management, unspecified: Secondary | ICD-10-CM | POA: Diagnosis not present

## 2019-04-20 DIAGNOSIS — F419 Anxiety disorder, unspecified: Secondary | ICD-10-CM | POA: Diagnosis not present

## 2019-04-20 DIAGNOSIS — K047 Periapical abscess without sinus: Secondary | ICD-10-CM | POA: Diagnosis not present

## 2019-04-20 DIAGNOSIS — F329 Major depressive disorder, single episode, unspecified: Secondary | ICD-10-CM | POA: Diagnosis not present

## 2019-04-20 DIAGNOSIS — Z1331 Encounter for screening for depression: Secondary | ICD-10-CM | POA: Diagnosis not present

## 2019-06-29 DIAGNOSIS — N926 Irregular menstruation, unspecified: Secondary | ICD-10-CM | POA: Diagnosis not present

## 2019-07-05 DIAGNOSIS — N6012 Diffuse cystic mastopathy of left breast: Secondary | ICD-10-CM | POA: Diagnosis not present

## 2019-07-05 DIAGNOSIS — N6321 Unspecified lump in the left breast, upper outer quadrant: Secondary | ICD-10-CM | POA: Diagnosis not present

## 2019-07-11 ENCOUNTER — Other Ambulatory Visit (HOSPITAL_COMMUNITY)
Admission: RE | Admit: 2019-07-11 | Discharge: 2019-07-11 | Disposition: A | Discharge status: Home / Self Care | Payer: Medicaid Other | Source: Ambulatory Visit | Attending: Nurse Practitioner | Admitting: Nurse Practitioner

## 2019-07-11 DIAGNOSIS — N6332 Unspecified lump in axillary tail of the left breast: Secondary | ICD-10-CM | POA: Diagnosis not present

## 2019-07-11 DIAGNOSIS — R599 Enlarged lymph nodes, unspecified: Secondary | ICD-10-CM | POA: Diagnosis not present

## 2019-07-11 DIAGNOSIS — N6320 Unspecified lump in the left breast, unspecified quadrant: Secondary | ICD-10-CM | POA: Diagnosis not present

## 2019-07-11 DIAGNOSIS — N6321 Unspecified lump in the left breast, upper outer quadrant: Secondary | ICD-10-CM | POA: Diagnosis not present

## 2019-07-11 DIAGNOSIS — R590 Localized enlarged lymph nodes: Secondary | ICD-10-CM | POA: Diagnosis not present

## 2019-07-13 LAB — SURGICAL PATHOLOGY

## 2019-07-27 DIAGNOSIS — F329 Major depressive disorder, single episode, unspecified: Secondary | ICD-10-CM | POA: Diagnosis not present

## 2019-07-27 DIAGNOSIS — F419 Anxiety disorder, unspecified: Secondary | ICD-10-CM | POA: Diagnosis not present

## 2019-11-09 DIAGNOSIS — J029 Acute pharyngitis, unspecified: Secondary | ICD-10-CM | POA: Diagnosis not present

## 2019-11-09 DIAGNOSIS — J40 Bronchitis, not specified as acute or chronic: Secondary | ICD-10-CM | POA: Diagnosis not present

## 2020-01-08 DIAGNOSIS — B373 Candidiasis of vulva and vagina: Secondary | ICD-10-CM | POA: Diagnosis not present

## 2020-01-08 DIAGNOSIS — N390 Urinary tract infection, site not specified: Secondary | ICD-10-CM | POA: Diagnosis not present

## 2020-01-08 DIAGNOSIS — R1900 Intra-abdominal and pelvic swelling, mass and lump, unspecified site: Secondary | ICD-10-CM | POA: Diagnosis not present

## 2020-01-08 DIAGNOSIS — E669 Obesity, unspecified: Secondary | ICD-10-CM | POA: Diagnosis not present

## 2020-01-08 DIAGNOSIS — N760 Acute vaginitis: Secondary | ICD-10-CM | POA: Diagnosis not present

## 2020-01-08 DIAGNOSIS — Z6838 Body mass index (BMI) 38.0-38.9, adult: Secondary | ICD-10-CM | POA: Diagnosis not present

## 2020-01-08 DIAGNOSIS — B9689 Other specified bacterial agents as the cause of diseases classified elsewhere: Secondary | ICD-10-CM | POA: Diagnosis not present

## 2020-01-11 DIAGNOSIS — R109 Unspecified abdominal pain: Secondary | ICD-10-CM | POA: Diagnosis not present

## 2020-01-11 DIAGNOSIS — R05 Cough: Secondary | ICD-10-CM | POA: Diagnosis not present

## 2020-01-11 DIAGNOSIS — K5641 Fecal impaction: Secondary | ICD-10-CM | POA: Diagnosis not present

## 2020-01-11 DIAGNOSIS — R1900 Intra-abdominal and pelvic swelling, mass and lump, unspecified site: Secondary | ICD-10-CM | POA: Diagnosis not present

## 2020-02-22 DIAGNOSIS — Z713 Dietary counseling and surveillance: Secondary | ICD-10-CM | POA: Diagnosis not present

## 2020-02-22 DIAGNOSIS — R350 Frequency of micturition: Secondary | ICD-10-CM | POA: Diagnosis not present

## 2020-02-22 DIAGNOSIS — E669 Obesity, unspecified: Secondary | ICD-10-CM | POA: Diagnosis not present

## 2020-02-22 DIAGNOSIS — M545 Low back pain: Secondary | ICD-10-CM | POA: Diagnosis not present

## 2020-02-22 DIAGNOSIS — G8929 Other chronic pain: Secondary | ICD-10-CM | POA: Diagnosis not present

## 2020-02-29 DIAGNOSIS — K121 Other forms of stomatitis: Secondary | ICD-10-CM | POA: Diagnosis not present

## 2020-02-29 DIAGNOSIS — H6590 Unspecified nonsuppurative otitis media, unspecified ear: Secondary | ICD-10-CM | POA: Diagnosis not present

## 2020-02-29 DIAGNOSIS — Z6837 Body mass index (BMI) 37.0-37.9, adult: Secondary | ICD-10-CM | POA: Diagnosis not present

## 2020-04-04 DIAGNOSIS — B9689 Other specified bacterial agents as the cause of diseases classified elsewhere: Secondary | ICD-10-CM | POA: Diagnosis not present

## 2020-04-04 DIAGNOSIS — N760 Acute vaginitis: Secondary | ICD-10-CM | POA: Diagnosis not present

## 2020-04-04 DIAGNOSIS — B372 Candidiasis of skin and nail: Secondary | ICD-10-CM | POA: Diagnosis not present

## 2020-04-04 DIAGNOSIS — J3489 Other specified disorders of nose and nasal sinuses: Secondary | ICD-10-CM | POA: Diagnosis not present

## 2020-07-28 DIAGNOSIS — M7989 Other specified soft tissue disorders: Secondary | ICD-10-CM | POA: Diagnosis not present

## 2020-07-28 DIAGNOSIS — R2231 Localized swelling, mass and lump, right upper limb: Secondary | ICD-10-CM | POA: Diagnosis not present

## 2020-07-28 DIAGNOSIS — Z9181 History of falling: Secondary | ICD-10-CM | POA: Diagnosis not present

## 2020-07-28 DIAGNOSIS — Z6837 Body mass index (BMI) 37.0-37.9, adult: Secondary | ICD-10-CM | POA: Diagnosis not present

## 2020-07-28 DIAGNOSIS — M79641 Pain in right hand: Secondary | ICD-10-CM | POA: Diagnosis not present

## 2020-07-29 DIAGNOSIS — F32A Depression, unspecified: Secondary | ICD-10-CM | POA: Diagnosis not present

## 2020-07-29 DIAGNOSIS — Z6837 Body mass index (BMI) 37.0-37.9, adult: Secondary | ICD-10-CM | POA: Diagnosis not present

## 2020-07-29 DIAGNOSIS — M79641 Pain in right hand: Secondary | ICD-10-CM | POA: Diagnosis not present

## 2020-07-29 DIAGNOSIS — F419 Anxiety disorder, unspecified: Secondary | ICD-10-CM | POA: Diagnosis not present

## 2021-02-17 DIAGNOSIS — M546 Pain in thoracic spine: Secondary | ICD-10-CM | POA: Diagnosis not present

## 2021-03-10 DIAGNOSIS — R030 Elevated blood-pressure reading, without diagnosis of hypertension: Secondary | ICD-10-CM | POA: Diagnosis not present

## 2021-03-10 DIAGNOSIS — M961 Postlaminectomy syndrome, not elsewhere classified: Secondary | ICD-10-CM | POA: Diagnosis not present

## 2021-03-10 DIAGNOSIS — Z6839 Body mass index (BMI) 39.0-39.9, adult: Secondary | ICD-10-CM | POA: Diagnosis not present

## 2021-03-11 ENCOUNTER — Other Ambulatory Visit: Payer: Self-pay | Admitting: Neurological Surgery

## 2021-03-11 DIAGNOSIS — M961 Postlaminectomy syndrome, not elsewhere classified: Secondary | ICD-10-CM

## 2021-03-19 ENCOUNTER — Ambulatory Visit
Admission: RE | Admit: 2021-03-19 | Discharge: 2021-03-19 | Disposition: A | Discharge status: Home / Self Care | Payer: BC Managed Care – PPO | Source: Ambulatory Visit | Attending: Neurological Surgery | Admitting: Neurological Surgery

## 2021-03-19 DIAGNOSIS — M961 Postlaminectomy syndrome, not elsewhere classified: Secondary | ICD-10-CM

## 2021-03-19 MED ORDER — DIAZEPAM 5 MG PO TABS
10.0000 mg | ORAL_TABLET | Freq: Once | ORAL | Status: AC
  Administered 2021-03-19: 10 mg via ORAL

## 2021-03-19 MED ORDER — IOPAMIDOL (ISOVUE-M 200) INJECTION 41%
18.0000 mL | Freq: Once | INTRAMUSCULAR | Status: AC
  Administered 2021-03-19: 18 mL via INTRATHECAL

## 2021-03-19 MED ORDER — ONDANSETRON HCL 4 MG/2ML IJ SOLN
4.0000 mg | Freq: Once | INTRAMUSCULAR | Status: AC | PRN
  Administered 2021-03-19: 4 mg via INTRAMUSCULAR

## 2021-03-19 MED ORDER — MEPERIDINE HCL 50 MG/ML IJ SOLN
50.0000 mg | Freq: Once | INTRAMUSCULAR | Status: AC | PRN
  Administered 2021-03-19: 50 mg via INTRAMUSCULAR

## 2021-03-19 NOTE — Discharge Instr - Other Orders (Addendum)
1008: pt c/o pain 8/10 from myelogram procedure. See MAR.  1027: Pt reports pain is starting to get better from PRN pain medication. Ranking pain 5/10 at this time.

## 2021-03-19 NOTE — Progress Notes (Signed)
Pt reports her spinal cord stimulator has been turned off for this procedure. She also reports last menstrual cycle beginning on 02/20/21 and no chance of pregnancy.

## 2021-03-19 NOTE — Discharge Instructions (Signed)

## 2021-03-24 DIAGNOSIS — M7061 Trochanteric bursitis, right hip: Secondary | ICD-10-CM | POA: Diagnosis not present

## 2021-03-24 DIAGNOSIS — M7062 Trochanteric bursitis, left hip: Secondary | ICD-10-CM | POA: Diagnosis not present

## 2021-04-24 DIAGNOSIS — J101 Influenza due to other identified influenza virus with other respiratory manifestations: Secondary | ICD-10-CM | POA: Diagnosis not present

## 2021-04-24 DIAGNOSIS — R071 Chest pain on breathing: Secondary | ICD-10-CM | POA: Diagnosis not present

## 2021-07-10 DIAGNOSIS — F329 Major depressive disorder, single episode, unspecified: Secondary | ICD-10-CM | POA: Diagnosis not present

## 2021-07-17 DIAGNOSIS — F4310 Post-traumatic stress disorder, unspecified: Secondary | ICD-10-CM | POA: Diagnosis not present

## 2021-07-28 DIAGNOSIS — F4310 Post-traumatic stress disorder, unspecified: Secondary | ICD-10-CM | POA: Diagnosis not present

## 2021-07-31 DIAGNOSIS — F329 Major depressive disorder, single episode, unspecified: Secondary | ICD-10-CM | POA: Diagnosis not present

## 2021-08-04 DIAGNOSIS — F4310 Post-traumatic stress disorder, unspecified: Secondary | ICD-10-CM | POA: Diagnosis not present

## 2021-08-18 DIAGNOSIS — F4310 Post-traumatic stress disorder, unspecified: Secondary | ICD-10-CM | POA: Diagnosis not present

## 2021-08-25 DIAGNOSIS — E559 Vitamin D deficiency, unspecified: Secondary | ICD-10-CM | POA: Diagnosis not present

## 2021-08-25 DIAGNOSIS — Z131 Encounter for screening for diabetes mellitus: Secondary | ICD-10-CM | POA: Diagnosis not present

## 2021-08-25 DIAGNOSIS — R768 Other specified abnormal immunological findings in serum: Secondary | ICD-10-CM | POA: Diagnosis not present

## 2021-08-25 DIAGNOSIS — N926 Irregular menstruation, unspecified: Secondary | ICD-10-CM | POA: Diagnosis not present

## 2021-08-25 DIAGNOSIS — R21 Rash and other nonspecific skin eruption: Secondary | ICD-10-CM | POA: Diagnosis not present

## 2021-08-25 DIAGNOSIS — D649 Anemia, unspecified: Secondary | ICD-10-CM | POA: Diagnosis not present

## 2021-09-01 DIAGNOSIS — F4310 Post-traumatic stress disorder, unspecified: Secondary | ICD-10-CM | POA: Diagnosis not present

## 2021-09-04 DIAGNOSIS — F419 Anxiety disorder, unspecified: Secondary | ICD-10-CM | POA: Diagnosis not present

## 2021-09-15 DIAGNOSIS — F4310 Post-traumatic stress disorder, unspecified: Secondary | ICD-10-CM | POA: Diagnosis not present

## 2021-09-16 DIAGNOSIS — Z6838 Body mass index (BMI) 38.0-38.9, adult: Secondary | ICD-10-CM | POA: Diagnosis not present

## 2021-09-16 DIAGNOSIS — R32 Unspecified urinary incontinence: Secondary | ICD-10-CM | POA: Diagnosis not present

## 2021-09-16 DIAGNOSIS — Z124 Encounter for screening for malignant neoplasm of cervix: Secondary | ICD-10-CM | POA: Diagnosis not present

## 2021-09-16 DIAGNOSIS — N898 Other specified noninflammatory disorders of vagina: Secondary | ICD-10-CM | POA: Diagnosis not present

## 2021-09-16 DIAGNOSIS — Z113 Encounter for screening for infections with a predominantly sexual mode of transmission: Secondary | ICD-10-CM | POA: Diagnosis not present

## 2021-09-16 DIAGNOSIS — R102 Pelvic and perineal pain: Secondary | ICD-10-CM | POA: Diagnosis not present

## 2021-09-16 DIAGNOSIS — Z01411 Encounter for gynecological examination (general) (routine) with abnormal findings: Secondary | ICD-10-CM | POA: Diagnosis not present

## 2021-09-16 DIAGNOSIS — N760 Acute vaginitis: Secondary | ICD-10-CM | POA: Diagnosis not present

## 2021-09-22 DIAGNOSIS — F4310 Post-traumatic stress disorder, unspecified: Secondary | ICD-10-CM | POA: Diagnosis not present

## 2021-09-28 DIAGNOSIS — F339 Major depressive disorder, recurrent, unspecified: Secondary | ICD-10-CM | POA: Diagnosis not present

## 2021-09-29 DIAGNOSIS — F4310 Post-traumatic stress disorder, unspecified: Secondary | ICD-10-CM | POA: Diagnosis not present

## 2021-09-30 DIAGNOSIS — R87810 Cervical high risk human papillomavirus (HPV) DNA test positive: Secondary | ICD-10-CM | POA: Diagnosis not present

## 2021-09-30 DIAGNOSIS — B009 Herpesviral infection, unspecified: Secondary | ICD-10-CM | POA: Diagnosis not present

## 2021-09-30 DIAGNOSIS — R102 Pelvic and perineal pain: Secondary | ICD-10-CM | POA: Diagnosis not present

## 2021-09-30 DIAGNOSIS — R87612 Low grade squamous intraepithelial lesion on cytologic smear of cervix (LGSIL): Secondary | ICD-10-CM | POA: Diagnosis not present

## 2021-10-27 DIAGNOSIS — L818 Other specified disorders of pigmentation: Secondary | ICD-10-CM | POA: Diagnosis not present

## 2021-10-27 DIAGNOSIS — L308 Other specified dermatitis: Secondary | ICD-10-CM | POA: Diagnosis not present

## 2021-10-27 DIAGNOSIS — L810 Postinflammatory hyperpigmentation: Secondary | ICD-10-CM | POA: Diagnosis not present

## 2021-10-27 DIAGNOSIS — R21 Rash and other nonspecific skin eruption: Secondary | ICD-10-CM | POA: Diagnosis not present

## 2021-11-02 DIAGNOSIS — F339 Major depressive disorder, recurrent, unspecified: Secondary | ICD-10-CM | POA: Diagnosis not present

## 2021-11-03 DIAGNOSIS — L700 Acne vulgaris: Secondary | ICD-10-CM | POA: Diagnosis not present

## 2021-11-03 DIAGNOSIS — L308 Other specified dermatitis: Secondary | ICD-10-CM | POA: Diagnosis not present

## 2021-11-03 DIAGNOSIS — L304 Erythema intertrigo: Secondary | ICD-10-CM | POA: Diagnosis not present

## 2021-11-10 DIAGNOSIS — F4310 Post-traumatic stress disorder, unspecified: Secondary | ICD-10-CM | POA: Diagnosis not present

## 2021-11-24 DIAGNOSIS — F4310 Post-traumatic stress disorder, unspecified: Secondary | ICD-10-CM | POA: Diagnosis not present

## 2021-12-01 DIAGNOSIS — F4310 Post-traumatic stress disorder, unspecified: Secondary | ICD-10-CM | POA: Diagnosis not present

## 2021-12-07 DIAGNOSIS — F603 Borderline personality disorder: Secondary | ICD-10-CM | POA: Diagnosis not present

## 2021-12-08 DIAGNOSIS — F4310 Post-traumatic stress disorder, unspecified: Secondary | ICD-10-CM | POA: Diagnosis not present

## 2021-12-22 DIAGNOSIS — F4310 Post-traumatic stress disorder, unspecified: Secondary | ICD-10-CM | POA: Diagnosis not present

## 2021-12-29 DIAGNOSIS — F4310 Post-traumatic stress disorder, unspecified: Secondary | ICD-10-CM | POA: Diagnosis not present

## 2022-01-04 DIAGNOSIS — R21 Rash and other nonspecific skin eruption: Secondary | ICD-10-CM | POA: Diagnosis not present

## 2022-01-04 DIAGNOSIS — R768 Other specified abnormal immunological findings in serum: Secondary | ICD-10-CM | POA: Diagnosis not present

## 2022-01-05 DIAGNOSIS — F4310 Post-traumatic stress disorder, unspecified: Secondary | ICD-10-CM | POA: Diagnosis not present

## 2022-01-07 DIAGNOSIS — K529 Noninfective gastroenteritis and colitis, unspecified: Secondary | ICD-10-CM | POA: Diagnosis not present

## 2022-01-10 DIAGNOSIS — J4540 Moderate persistent asthma, uncomplicated: Secondary | ICD-10-CM | POA: Diagnosis not present

## 2022-01-18 DIAGNOSIS — K0889 Other specified disorders of teeth and supporting structures: Secondary | ICD-10-CM | POA: Diagnosis not present

## 2022-01-18 DIAGNOSIS — K047 Periapical abscess without sinus: Secondary | ICD-10-CM | POA: Diagnosis not present

## 2022-01-18 DIAGNOSIS — R051 Acute cough: Secondary | ICD-10-CM | POA: Diagnosis not present

## 2022-01-18 DIAGNOSIS — J069 Acute upper respiratory infection, unspecified: Secondary | ICD-10-CM | POA: Diagnosis not present

## 2022-01-19 DIAGNOSIS — F411 Generalized anxiety disorder: Secondary | ICD-10-CM | POA: Diagnosis not present

## 2022-01-26 DIAGNOSIS — F4310 Post-traumatic stress disorder, unspecified: Secondary | ICD-10-CM | POA: Diagnosis not present

## 2022-02-16 DIAGNOSIS — R102 Pelvic and perineal pain: Secondary | ICD-10-CM | POA: Diagnosis not present

## 2022-02-23 DIAGNOSIS — F4310 Post-traumatic stress disorder, unspecified: Secondary | ICD-10-CM | POA: Diagnosis not present

## 2022-02-24 DIAGNOSIS — Z9682 Presence of neurostimulator: Secondary | ICD-10-CM | POA: Diagnosis not present

## 2022-02-24 DIAGNOSIS — Z20822 Contact with and (suspected) exposure to covid-19: Secondary | ICD-10-CM | POA: Diagnosis not present

## 2022-02-24 DIAGNOSIS — R519 Headache, unspecified: Secondary | ICD-10-CM | POA: Diagnosis not present

## 2022-02-24 DIAGNOSIS — Z981 Arthrodesis status: Secondary | ICD-10-CM | POA: Diagnosis not present

## 2022-02-24 DIAGNOSIS — R509 Fever, unspecified: Secondary | ICD-10-CM | POA: Diagnosis not present

## 2022-02-24 DIAGNOSIS — M549 Dorsalgia, unspecified: Secondary | ICD-10-CM | POA: Diagnosis not present

## 2022-02-24 DIAGNOSIS — N200 Calculus of kidney: Secondary | ICD-10-CM | POA: Diagnosis not present

## 2022-02-24 DIAGNOSIS — Z9049 Acquired absence of other specified parts of digestive tract: Secondary | ICD-10-CM | POA: Diagnosis not present

## 2022-02-24 DIAGNOSIS — R350 Frequency of micturition: Secondary | ICD-10-CM | POA: Diagnosis not present

## 2022-02-24 DIAGNOSIS — N12 Tubulo-interstitial nephritis, not specified as acute or chronic: Secondary | ICD-10-CM | POA: Diagnosis not present

## 2022-02-24 DIAGNOSIS — F32A Depression, unspecified: Secondary | ICD-10-CM | POA: Diagnosis not present

## 2022-02-24 DIAGNOSIS — R109 Unspecified abdominal pain: Secondary | ICD-10-CM | POA: Diagnosis not present

## 2022-02-24 DIAGNOSIS — R1031 Right lower quadrant pain: Secondary | ICD-10-CM | POA: Diagnosis not present

## 2022-02-24 DIAGNOSIS — Z880 Allergy status to penicillin: Secondary | ICD-10-CM | POA: Diagnosis not present

## 2022-02-24 DIAGNOSIS — F17210 Nicotine dependence, cigarettes, uncomplicated: Secondary | ICD-10-CM | POA: Diagnosis not present

## 2022-02-24 DIAGNOSIS — F419 Anxiety disorder, unspecified: Secondary | ICD-10-CM | POA: Diagnosis not present

## 2022-03-09 DIAGNOSIS — F4310 Post-traumatic stress disorder, unspecified: Secondary | ICD-10-CM | POA: Diagnosis not present

## 2022-03-23 DIAGNOSIS — F4310 Post-traumatic stress disorder, unspecified: Secondary | ICD-10-CM | POA: Diagnosis not present

## 2022-03-30 DIAGNOSIS — F4310 Post-traumatic stress disorder, unspecified: Secondary | ICD-10-CM | POA: Diagnosis not present

## 2022-04-13 DIAGNOSIS — F4310 Post-traumatic stress disorder, unspecified: Secondary | ICD-10-CM | POA: Diagnosis not present

## 2022-04-20 DIAGNOSIS — J4540 Moderate persistent asthma, uncomplicated: Secondary | ICD-10-CM | POA: Diagnosis not present

## 2022-04-20 DIAGNOSIS — F4310 Post-traumatic stress disorder, unspecified: Secondary | ICD-10-CM | POA: Diagnosis not present

## 2022-05-15 DIAGNOSIS — J4520 Mild intermittent asthma, uncomplicated: Secondary | ICD-10-CM | POA: Diagnosis not present

## 2023-08-26 ENCOUNTER — Other Ambulatory Visit: Payer: Self-pay | Admitting: Student

## 2023-08-26 DIAGNOSIS — M961 Postlaminectomy syndrome, not elsewhere classified: Secondary | ICD-10-CM

## 2023-08-29 NOTE — Discharge Instructions (Signed)

## 2023-08-30 ENCOUNTER — Ambulatory Visit
Admission: RE | Admit: 2023-08-30 | Discharge: 2023-08-30 | Disposition: A | Discharge status: Home / Self Care | Source: Ambulatory Visit | Attending: Student | Admitting: Student

## 2023-08-30 ENCOUNTER — Ambulatory Visit
Admission: RE | Admit: 2023-08-30 | Discharge: 2023-08-30 | Disposition: A | Discharge status: Home / Self Care | Source: Ambulatory Visit | Attending: Student

## 2023-08-30 DIAGNOSIS — M961 Postlaminectomy syndrome, not elsewhere classified: Secondary | ICD-10-CM

## 2023-08-30 MED ORDER — IOPAMIDOL (ISOVUE-M 200) INJECTION 41%
20.0000 mL | Freq: Once | INTRAMUSCULAR | Status: AC
  Administered 2023-08-30: 20 mL via INTRATHECAL

## 2023-08-30 MED ORDER — DIAZEPAM 5 MG PO TABS
10.0000 mg | ORAL_TABLET | Freq: Once | ORAL | Status: AC
  Administered 2023-08-30: 10 mg via ORAL

## 2023-08-30 MED ORDER — MEPERIDINE HCL 50 MG/ML IJ SOLN: 50.0000 mg | Freq: Once | INTRAMUSCULAR | Status: DC | PRN

## 2023-08-30 MED ORDER — ONDANSETRON HCL 4 MG/2ML IJ SOLN: 4.0000 mg | Freq: Once | INTRAMUSCULAR | Status: DC | PRN
